# Patient Record
Sex: Female | Born: 1955 | Race: White | Hispanic: No | Smoking: Current some day smoker
Health system: Southern US, Community
[De-identification: ages and names within clinical notes are randomized; demographics above are authoritative.]

## PROBLEM LIST (undated history)

## (undated) DIAGNOSIS — K219 Gastro-esophageal reflux disease without esophagitis: Secondary | ICD-10-CM

## (undated) DIAGNOSIS — E039 Hypothyroidism, unspecified: Secondary | ICD-10-CM

## (undated) DIAGNOSIS — G709 Myoneural disorder, unspecified: Secondary | ICD-10-CM

## (undated) DIAGNOSIS — G4730 Sleep apnea, unspecified: Secondary | ICD-10-CM

## (undated) DIAGNOSIS — R51 Headache: Secondary | ICD-10-CM

## (undated) DIAGNOSIS — Z8489 Family history of other specified conditions: Secondary | ICD-10-CM

## (undated) DIAGNOSIS — F419 Anxiety disorder, unspecified: Secondary | ICD-10-CM

## (undated) DIAGNOSIS — R519 Headache, unspecified: Secondary | ICD-10-CM

## (undated) DIAGNOSIS — I10 Essential (primary) hypertension: Secondary | ICD-10-CM

## (undated) HISTORY — PX: STAPEDECTOMY: SHX2435

---

## 2004-04-17 ENCOUNTER — Other Ambulatory Visit: Payer: Self-pay

## 2004-06-03 ENCOUNTER — Ambulatory Visit: Payer: Self-pay | Admitting: Unknown Physician Specialty

## 2005-07-30 ENCOUNTER — Ambulatory Visit: Payer: Self-pay | Admitting: Internal Medicine

## 2005-10-20 ENCOUNTER — Encounter: Payer: Self-pay | Admitting: Neurology

## 2005-11-02 ENCOUNTER — Encounter: Payer: Self-pay | Admitting: Neurology

## 2005-12-02 ENCOUNTER — Encounter: Payer: Self-pay | Admitting: Neurology

## 2012-11-12 ENCOUNTER — Ambulatory Visit: Payer: Self-pay | Admitting: Neurology

## 2012-11-12 LAB — CREATININE, SERUM
EGFR (African American): >60
EGFR (Non-African Amer.): >60

## 2016-04-02 NOTE — Patient Instructions (Signed)
  Your procedure is scheduled on: 04-08-16 (TUESDAY) Report to Same Day Surgery 2nd floor medical mall To find out your arrival time please call [phone] between 1PM - 3PM on 04-04-16 [facility](FRIEDAY)  Remember: Instructions that are not followed completely may result in serious medical risk, up to and including death, or upon the discretion of your surgeon and anesthesiologist your surgery may need to be rescheduled.    _x___ 1. Do not eat food or drink liquids after midnight. No gum chewing or hard candies.     __x__ 2. No Alcohol for 24 hours before or after surgery.   __x__3. No Smoking for 24 prior to surgery.   ____  4. Bring all medications with you on the day of surgery if instructed.    __x__ 5. Notify your doctor if there is any change in your medical condition     (cold, fever, infections).     Do not wear jewelry, make-up, hairpins, clips or nail polish.  Do not wear lotions, powders, or perfumes. You may wear deodorant.  Do not shave 48 hours prior to surgery. Men may shave face and neck.  Do not bring valuables to the hospital.    [facility] belongings or valuables.               Contacts, dentures or bridgework may not be worn into surgery.  Leave your suitcase in the car. After surgery it may be brought to your room.  For patients admitted to the hospital, discharge time is determined by your treatment team.   Patients discharged the day of surgery will not be allowed to drive home.    Please read over the following fact sheets that you were given:   [facility] Pre[facility] and or MRSA Information   _x___ Take these medicines the morning of surgery with A SIP OF WATER:    1. BACLOFEN  2. RAMIPRIL  3. LEVOTHYROXINE  4. SERTRALINE  5. OXYBUTININ  6.  ____ Fleet Enema (as directed)   ____ Use CHG Soap or sage wipes as directed on instruction sheet   ____ Use inhalers on the day of surgery and bring to hospital day of  surgery  ____ Stop metformin 2 days prior to surgery    ____ Take 1/2 of usual insulin dose the night before surgery and none on the morning of surgery.   ____ Stop aspirin or coumadin, or plavix  _x__ Stop Anti-inflammatories such as Advil, Aleve, Ibuprofen, Motrin, Naproxen,          Naprosyn, Goodies powders or aspirin products. Ok to take Tylenol.   _X___ Stop supplements until after surgery-STOP MELATONIN NOW  ____ Bring C-Pap to the hospital.

## 2016-04-03 ENCOUNTER — Encounter
Admission: RE | Admit: 2016-04-03 | Discharge: 2016-04-03 | Disposition: A | Discharge status: Home / Self Care | Payer: Medicare Other | Source: Ambulatory Visit | Attending: Urology | Admitting: Urology

## 2016-04-03 DIAGNOSIS — I10 Essential (primary) hypertension: Secondary | ICD-10-CM | POA: Diagnosis not present

## 2016-04-03 DIAGNOSIS — Z01810 Encounter for preprocedural cardiovascular examination: Secondary | ICD-10-CM | POA: Diagnosis not present

## 2016-04-03 HISTORY — DX: Gastro-esophageal reflux disease without esophagitis: K21.9

## 2016-04-03 HISTORY — DX: Essential (primary) hypertension: I10

## 2016-04-03 HISTORY — DX: Headache: R51

## 2016-04-03 HISTORY — DX: Headache, unspecified: R51.9

## 2016-04-03 HISTORY — DX: Hypothyroidism, unspecified: E03.9

## 2016-04-03 HISTORY — DX: Sleep apnea, unspecified: G47.30

## 2016-04-03 HISTORY — DX: Family history of other specified conditions: Z84.89

## 2016-04-03 HISTORY — DX: Myoneural disorder, unspecified: G70.9

## 2016-04-08 ENCOUNTER — Ambulatory Visit: Payer: Medicare Other | Admitting: Anesthesiology

## 2016-04-08 ENCOUNTER — Encounter: Payer: Self-pay | Admitting: *Deleted

## 2016-04-08 ENCOUNTER — Encounter
Admission: RE | Disposition: A | Discharge status: Home / Self Care | Payer: Self-pay | Source: Ambulatory Visit | Attending: Urology

## 2016-04-08 ENCOUNTER — Ambulatory Visit
Admission: RE | Admit: 2016-04-08 | Discharge: 2016-04-08 | Disposition: A | Discharge status: Home / Self Care | Payer: Medicare Other | Source: Ambulatory Visit | Attending: Urology | Admitting: Urology

## 2016-04-08 DIAGNOSIS — Z79899 Other long term (current) drug therapy: Secondary | ICD-10-CM | POA: Insufficient documentation

## 2016-04-08 DIAGNOSIS — N319 Neuromuscular dysfunction of bladder, unspecified: Secondary | ICD-10-CM | POA: Diagnosis not present

## 2016-04-08 DIAGNOSIS — F17200 Nicotine dependence, unspecified, uncomplicated: Secondary | ICD-10-CM | POA: Insufficient documentation

## 2016-04-08 DIAGNOSIS — I10 Essential (primary) hypertension: Secondary | ICD-10-CM | POA: Diagnosis not present

## 2016-04-08 DIAGNOSIS — G4730 Sleep apnea, unspecified: Secondary | ICD-10-CM | POA: Diagnosis not present

## 2016-04-08 DIAGNOSIS — Z9889 Other specified postprocedural states: Secondary | ICD-10-CM | POA: Diagnosis not present

## 2016-04-08 DIAGNOSIS — N3941 Urge incontinence: Secondary | ICD-10-CM | POA: Diagnosis not present

## 2016-04-08 DIAGNOSIS — E039 Hypothyroidism, unspecified: Secondary | ICD-10-CM | POA: Diagnosis not present

## 2016-04-08 DIAGNOSIS — N3281 Overactive bladder: Secondary | ICD-10-CM | POA: Insufficient documentation

## 2016-04-08 DIAGNOSIS — Z791 Long term (current) use of non-steroidal anti-inflammatories (NSAID): Secondary | ICD-10-CM | POA: Insufficient documentation

## 2016-04-08 HISTORY — PX: CYSTOSCOPY: SHX5120

## 2016-04-08 HISTORY — PX: BOTOX INJECTION: SHX5754

## 2016-04-08 SURGERY — CYSTOSCOPY
Anesthesia: General | Site: Bladder | Wound class: Clean Contaminated

## 2016-04-08 MED ORDER — SODIUM CHLORIDE 0.9 % IJ SOLN
INTRAMUSCULAR | Status: AC
  Filled 2016-04-08: qty 50

## 2016-04-08 MED ORDER — LIDOCAINE HCL 2 % EX GEL
CUTANEOUS | Status: AC
  Filled 2016-04-08: qty 10

## 2016-04-08 MED ORDER — ONDANSETRON HCL 4 MG/2ML IJ SOLN: 4.0000 mg | Freq: Once | INTRAMUSCULAR | Status: DC | PRN

## 2016-04-08 MED ORDER — BELLADONNA ALKALOIDS-OPIUM 16.2-60 MG RE SUPP
RECTAL | Status: AC
  Filled 2016-04-08: qty 1

## 2016-04-08 MED ORDER — EPHEDRINE SULFATE 50 MG/ML IJ SOLN
INTRAMUSCULAR | Status: DC | PRN
  Administered 2016-04-08 (×2): 5 mg via INTRAVENOUS

## 2016-04-08 MED ORDER — MIDAZOLAM HCL 5 MG/5ML IJ SOLN
INTRAMUSCULAR | Status: DC | PRN
  Administered 2016-04-08: 2 mg via INTRAVENOUS

## 2016-04-08 MED ORDER — PROPOFOL 10 MG/ML IV BOLUS
INTRAVENOUS | Status: DC | PRN
  Administered 2016-04-08: 150 mg via INTRAVENOUS

## 2016-04-08 MED ORDER — ONDANSETRON HCL 4 MG/2ML IJ SOLN
INTRAMUSCULAR | Status: DC | PRN
  Administered 2016-04-08: 4 mg via INTRAVENOUS

## 2016-04-08 MED ORDER — URIBEL 118 MG PO CAPS: 1.0000 | ORAL_CAPSULE | Freq: Four times a day (QID) | ORAL | 3 refills | Status: DC | PRN

## 2016-04-08 MED ORDER — LIDOCAINE HCL 2 % EX GEL
CUTANEOUS | Status: DC | PRN
  Administered 2016-04-08: 1

## 2016-04-08 MED ORDER — FENTANYL CITRATE (PF) 100 MCG/2ML IJ SOLN: 25.0000 ug | INTRAMUSCULAR | Status: DC | PRN

## 2016-04-08 MED ORDER — PHENYLEPHRINE HCL 10 MG/ML IJ SOLN
INTRAMUSCULAR | Status: DC | PRN
  Administered 2016-04-08: 50 ug via INTRAVENOUS

## 2016-04-08 MED ORDER — FENTANYL CITRATE (PF) 100 MCG/2ML IJ SOLN
INTRAMUSCULAR | Status: DC | PRN
  Administered 2016-04-08 (×2): 25 ug via INTRAVENOUS

## 2016-04-08 MED ORDER — DEXAMETHASONE SODIUM PHOSPHATE 10 MG/ML IJ SOLN
INTRAMUSCULAR | Status: DC | PRN
  Administered 2016-04-08: 10 mg via INTRAVENOUS

## 2016-04-08 MED ORDER — LEVOFLOXACIN IN D5W 500 MG/100ML IV SOLN
INTRAVENOUS | Status: AC
  Administered 2016-04-08: 500 mg via INTRAVENOUS
  Filled 2016-04-08: qty 100

## 2016-04-08 MED ORDER — FAMOTIDINE 20 MG PO TABS
20.0000 mg | ORAL_TABLET | Freq: Once | ORAL | Status: AC
  Administered 2016-04-08: 20 mg via ORAL

## 2016-04-08 MED ORDER — LIDOCAINE HCL (CARDIAC) 20 MG/ML IV SOLN
INTRAVENOUS | Status: DC | PRN
  Administered 2016-04-08: 45 mg via INTRAVENOUS

## 2016-04-08 MED ORDER — ONABOTULINUMTOXINA 100 UNITS IJ SOLR
INTRAMUSCULAR | Status: DC | PRN
  Administered 2016-04-08: 100 [IU] via INTRAMUSCULAR

## 2016-04-08 MED ORDER — FAMOTIDINE 20 MG PO TABS
ORAL_TABLET | ORAL | Status: AC
Start: 2016-04-08 — End: 2016-04-08
  Administered 2016-04-08: 20 mg via ORAL
  Filled 2016-04-08: qty 1

## 2016-04-08 MED ORDER — LEVOFLOXACIN IN D5W 500 MG/100ML IV SOLN
500.0000 mg | Freq: Once | INTRAVENOUS | Status: AC
  Administered 2016-04-08: 500 mg via INTRAVENOUS

## 2016-04-08 MED ORDER — LACTATED RINGERS IV SOLN
INTRAVENOUS | Status: DC
  Administered 2016-04-08 (×2): via INTRAVENOUS

## 2016-04-08 SURGICAL SUPPLY — 20 items
BAG DRAIN CYSTO-URO LG1000N (MISCELLANEOUS) ×4 IMPLANT
ELECT REM PT RETURN 9FT ADLT (ELECTROSURGICAL)
ELECTRODE REM PT RTRN 9FT ADLT (ELECTROSURGICAL) IMPLANT
GLOVE BIO SURGEON STRL SZ7 (GLOVE) ×4 IMPLANT
GLOVE BIO SURGEON STRL SZ7.5 (GLOVE) ×4 IMPLANT
GOWN STRL REUS W/ TWL LRG LVL3 (GOWN DISPOSABLE) ×2 IMPLANT
GOWN STRL REUS W/ TWL XL LVL3 (GOWN DISPOSABLE) ×2 IMPLANT
GOWN STRL REUS W/TWL LRG LVL3 (GOWN DISPOSABLE) ×2
GOWN STRL REUS W/TWL XL LVL3 (GOWN DISPOSABLE) ×2
KIT RM TURNOVER CYSTO AR (KITS) ×4 IMPLANT
NDL SAFETY ECLIPSE 18X1.5 (NEEDLE) ×4 IMPLANT
NEEDLE HYPO 18GX1.5 SHARP (NEEDLE) ×4
NEEDLE INJETAK FLEX 70CM BOTOX (NEEDLE) ×4 IMPLANT
PACK CYSTO AR (MISCELLANEOUS) ×4 IMPLANT
PREP PVP WINGED SPONGE (MISCELLANEOUS) IMPLANT
SET CYSTO W/LG BORE CLAMP LF (SET/KITS/TRAYS/PACK) ×4 IMPLANT
SURGILUBE 2OZ TUBE FLIPTOP (MISCELLANEOUS) ×4 IMPLANT
SYRINGE 10CC LL (SYRINGE) ×8 IMPLANT
WATER STERILE IRR 1000ML POUR (IV SOLUTION) ×4 IMPLANT
WATER STERILE IRR 3000ML UROMA (IV SOLUTION) ×4 IMPLANT

## 2016-04-08 NOTE — Anesthesia Preprocedure Evaluation (Signed)
Anesthesia Evaluation  Patient identified by MRN, date of birth, ID band Patient awake    Reviewed: Allergy & Precautions, NPO status , Patient's Chart, lab work & pertinent test results  History of Anesthesia Complications Negative for: history of anesthetic complications  Airway Mallampati: II       Dental   Pulmonary sleep apnea , Current Smoker,           Cardiovascular hypertension, Pt. on medications      Neuro/Psych    GI/Hepatic Neg liver ROS, GERD (pt denies)  Medicated,  Endo/Other  Hypothyroidism   Renal/GU negative Renal ROS     Musculoskeletal   Abdominal   Peds  Hematology negative hematology ROS (+)   Anesthesia Other Findings   Reproductive/Obstetrics                             Anesthesia Physical Anesthesia Plan  ASA: III  Anesthesia Plan: General   Post-op Pain Management:    Induction:   Airway Management Planned: LMA  Additional Equipment:   Intra-op Plan:   Post-operative Plan:   Informed Consent: I have reviewed the patients History and Physical, chart, labs and discussed the procedure including the risks, benefits and alternatives for the proposed anesthesia with the patient or authorized representative who has indicated his/her understanding and acceptance.     Plan Discussed with:   Anesthesia Plan Comments:         Anesthesia Quick Evaluation

## 2016-04-08 NOTE — H&P (Signed)
Date of Initial H&P: 04/01/16  History reviewed, patient examined, no change in status, stable for surgery.

## 2016-04-08 NOTE — Anesthesia Postprocedure Evaluation (Signed)
Anesthesia Post Note  Patient: Melissa FurbishKathey E Hunter  Procedure(s) Performed: Procedure(s) (LRB): CYSTOSCOPY (N/A) BOTOX INJECTION (N/A)  Patient location during evaluation: PACU Anesthesia Type: General Level of consciousness: awake and alert Pain management: pain level controlled Vital Signs Assessment: post-procedure vital signs reviewed and stable Respiratory status: spontaneous breathing and respiratory function stable Cardiovascular status: stable Anesthetic complications: no    Last Vitals:  Vitals:   04/08/16 1610 04/08/16 1625  BP: 118/89 123/81  Pulse: 74 63  Resp: 18 13  Temp: (!) 35.8 C     Last Pain:  Vitals:   04/08/16 1610  TempSrc:   PainSc: Asleep                 KEPHART,WILLIAM K

## 2016-04-08 NOTE — Anesthesia Procedure Notes (Signed)
Procedure Name: LMA Insertion Date/Time: 04/08/2016 3:33 PM Performed by: Lily Kocher Pre-anesthesia Checklist: Patient identified, Patient being monitored, Timeout performed, Emergency Drugs available and Suction available Patient Re-evaluated:Patient Re-evaluated prior to inductionOxygen Delivery Method: Circle system utilized Preoxygenation: Pre-oxygenation with 100% oxygen Intubation Type: IV induction Ventilation: Mask ventilation without difficulty LMA: LMA inserted LMA Size: 4.0 Tube type: Oral Number of attempts: 1 Placement Confirmation: positive ETCO2 Tube secured with: Tape Dental Injury: Teeth and Oropharynx as per pre-operative assessment

## 2016-04-08 NOTE — Discharge Instructions (Addendum)
AMBULATORY SURGERY  DISCHARGE INSTRUCTIONS   1) The drugs that you were given will stay in your system until tomorrow so for the next 24 hours you should not:  A) Drive an automobile B) Make any legal decisions C) Drink any alcoholic beverage   2) You may resume regular meals tomorrow.  Today it is better to start with liquids and gradually work up to solid foods.  You may eat anything you prefer, but it is better to start with liquids, then soup and crackers, and gradually work up to solid foods.   3) Please notify your doctor immediately if you have any unusual bleeding, trouble breathing, redness and pain at the surgery site, drainage, fever, or pain not relieved by medication.   4) Additional Instructions:  Botulinum Toxin Bladder Injection, Care After Refer to this sheet in the next few weeks. These instructions provide you with information about caring for yourself after your procedure. Your health care provider may also give you more specific instructions. Your treatment has been planned according to current medical practices, but problems sometimes occur. Call your health care provider if you have any problems or questions after your procedure. WHAT TO EXPECT AFTER THE PROCEDURE After your procedure, it is common to have:  Blood-tinged urine.  Burning or soreness when you pass urine. HOME CARE INSTRUCTIONS  Do not drive for 24 hours if you received a sedative.  Take over-the-counter and prescription medicines only as told by your health care provider.  If you were prescribed an antibiotic medicine, take it as told by your health care provider. Do not stop taking the antibiotic even if you start to feel better.  Drink enough fluid to keep your urine clear or pale yellow.  Return to your normal activities as told by your health care provider. Ask your health care provider what activities are safe for you.  Keep all follow-up visits as told by your health care provider.  This is important. SEEK MEDICAL CARE IF:  You have a fever or chills.  You have blood-tinged urine for more than one day after your procedure.  You have worsening pain or burning when you pass urine.  You have pain or burning when passing urine for more than two days after your procedure.  You have trouble emptying your bladder. SEEK IMMEDIATE MEDICAL CARE IF:  You have bright red blood in your urine.  You are unable to pass urine.   This information is not intended to replace advice given to you by your health care provider. Make sure you discuss any questions you have with your health care provider.   Document Released: 04/11/2015 Document Reviewed: 01/17/2015 Elsevier Interactive Patient Education 2016 [employer] Neurogenic Bladder Neurogenic bladder is a bladder control disorder. It is caused by problems with the nerves that control your bladder. This condition may make your bladder overactive or underactive. You may have trouble holding your urine or passing your urine (urinating). The bladder is a hollow organ in the lower part of your abdomen. It stores urine after the urine is made by your kidneys. Normally, when your bladder is not full, the muscles that control your bladder are relaxed. When your bladder fills with urine, nerve signals are sent to your brain, indicating that your bladder is full. Your brain then sends signals through your spinal cord to the muscles in your bladder that start and stop urine flow. If you have neurogenic bladder, the nerves and muscles do not work together the way they  should. CAUSES  Any kind of nerve damage or condition that disrupts the signals from your brain to your bladder can cause neurogenic bladder. Many things can cause these nerve problems. They include:  A disease that affects the nervous system, such as:  Alzheimer disease.  Cerebral palsy.  Multiple sclerosis.  Diabetes.  Parkinson disease.  Damage to your brain or  spinal cord from:  Trauma.  Tumor.  Infection.  Surgery.  Alcohol abuse.  Stroke.  A spinal cord birth defect. RISK FACTORS Having nerve damage or a nerve disorder puts you at risk for neurogenic bladder. SIGNS AND SYMPTOMS  Signs and symptoms of neurogenic bladder include:  Leaking or gushing urine (incontinence).  A sudden, strong urge to pass urine (urgency).  Frequent urination during the day and night.  Being unable to empty your bladder completely (urinary retention).  Frequent urinary tract infections. DIAGNOSIS  Your health care provider may diagnose neurogenic bladder based on your symptoms and medical history. A physical exam will also be done. You may be asked to keep a record of your bladder symptoms and the times that you urinate (bladder diary). Your health care provider may also do several tests to help diagnose neurogenic bladder, including:  A urine test to check for infection.  A bladder scan after you urinate to see how much urine is left in your bladder.  Various tests to measure your urine flow and see how well the flow is controlled (urodynamic tests).  A procedure that involves using a tool that is like a very thin telescope to look through your urethra into your bladder (cystoscopy). A health care provider who specializes in the urinary tract (urologist) may do this test.  Imaging tests of your brain or spine. TREATMENT  Treatment depends on the cause of your neurogenic bladder and the symptoms you are having. Work closely with your health care provider to find the treatments that will improve your quality of life. Treatment options include:  Learning ways to control when you urinate, such as:  Urinating at scheduled times.  Training yourself to delay urination.  Doing exercises (Kegel exercises) to strengthen the muscles that control urine flow.  Avoiding foods or drinks that make your symptoms worse.  Taking medicines to:  Stimulate an  underactive bladder.  Relax an overactive bladder.  Treat a urinary tract infection.  Learning how to use a thin tube (catheter) to empty your bladder. A catheter is a hollow tube that you pass through your urethra.  Procedures to stimulate the nerves that control your bladder.  Surgical procedures if other treatments do not help. HOME CARE INSTRUCTIONS   Keep a bladder diary to find out which foods, liquids, or activities make your symptoms worse.  Use your bladder diary to schedule bathroom trips. If you are away from home, plan to be near a bathroom when your schedule says you need one.  Do Kegel exercises to strengthen the muscles that control urination. These muscles are the ones you use to try to hold urine when you need to go. To do Kegel exercises:  Squeeze these muscles tight and hold for about 10 seconds.  Repeat three times.  Do these exercises often during the day when you do not have to urinate.  Limit your drinking of beverages that stimulate urination. These include soda, coffee, and tea.  After urinating, wait a few minutes and try again (double voiding).  Make sure you urinate just before you leave the house and just before you  go to bed.  Take medicines only as directed by your health care provider.  Keep all follow-up visits as directed by your health care provider. This is important. SEEK MEDICAL CARE IF:   You are having a hard time controlling your symptoms.  Your symptoms are getting worse.  You have signs of a urinary tract infection:  A burning feeling when you urinate.  Chills.  Fever. SEEK IMMEDIATE MEDICAL CARE IF:  You cannot pass urine.    This information is not intended to replace advice given to you by your health care provider. Make sure you discuss any questions you have with your health care provider.   Document Released: 02/01/2007 Document Revised: 08/11/2014 Document Reviewed: 11/01/2013 Elsevier Interactive Patient Education  [employer]

## 2016-04-08 NOTE — Transfer of Care (Signed)
Immediate Anesthesia Transfer of Care Note  Patient: Melissa Hunter  Procedure(s) Performed: Procedure(s): CYSTOSCOPY (N/A) BOTOX INJECTION (N/A)  Patient Location: PACU  Anesthesia Type:General  Level of Consciousness: sedated  Airway & Oxygen Therapy: Patient Spontanous Breathing and Patient connected to face mask oxygen  Post-op Assessment: Report given to RN and Post -op Vital signs reviewed and stable  Post vital signs: Reviewed and stable  Last Vitals:  Vitals:   04/08/16 1348 04/08/16 1610  BP: (!) 139/95 118/89  Pulse: 77 74  Resp: 16 18  Temp: 36.8 C (!) 35.8 C    Last Pain:  Vitals:   04/08/16 1610  TempSrc:   PainSc: Asleep         Complications: No apparent anesthesia complications

## 2016-04-08 NOTE — Op Note (Signed)
Preoperative diagnosis: 1. Urge incontinence                                            2. Overactive bladder                                            3. Neurogenic bladder   Postoperative diagnosis: Same  Procedure: Cystoscopy with bladder Botox injection  Surgeon: Suszanne ConnersMichael R. Evelene CroonWolff MD  Anesthesia: General  Indications:See the history and physical. After informed consent the above procedure(s) were requested     Technique and findings: After adequate general anesthesia had been obtained the patient was placed into dorsolithotomy position and the perineum was prepped and draped in the usual fashion. The 3521 French the scope was coupled to the camera and then placed into the bladder. The bladder was thoroughly inspected. Ureteral orifices were identified and had clear reflux. 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 No bladder mucosal tumors were identified. The bladder was not trabeculated. At this point Botox injection needle was introduced through the scope and injection pattern of 4 rows of 5 injections with 0.5 cc of Botox was completed. The depth of injection was 3 mm. At this point the bladder was drained and the cystoscope removed. 10 cc of viscous Xylocaine was instilled within the urethra and the bladder. The procedure was terminated and patient transferred to the recovery room in stable condition.

## 2016-04-10 ENCOUNTER — Encounter: Payer: Self-pay | Admitting: Urology

## 2016-04-16 ENCOUNTER — Other Ambulatory Visit: Payer: Self-pay | Admitting: Neurology

## 2016-04-16 DIAGNOSIS — G35 Multiple sclerosis: Secondary | ICD-10-CM

## 2016-06-14 ENCOUNTER — Ambulatory Visit
Admission: RE | Admit: 2016-06-14 | Discharge: 2016-06-14 | Disposition: A | Discharge status: Home / Self Care | Payer: Medicare Other | Source: Ambulatory Visit | Attending: Neurology | Admitting: Neurology

## 2016-06-14 DIAGNOSIS — M2578 Osteophyte, vertebrae: Secondary | ICD-10-CM | POA: Insufficient documentation

## 2016-06-14 DIAGNOSIS — M4802 Spinal stenosis, cervical region: Secondary | ICD-10-CM | POA: Diagnosis not present

## 2016-06-14 DIAGNOSIS — G35 Multiple sclerosis: Secondary | ICD-10-CM | POA: Diagnosis present

## 2016-06-14 DIAGNOSIS — M50221 Other cervical disc displacement at C4-C5 level: Secondary | ICD-10-CM | POA: Diagnosis not present

## 2016-10-27 ENCOUNTER — Encounter
Admission: RE | Admit: 2016-10-27 | Discharge: 2016-10-27 | Disposition: A | Discharge status: Home / Self Care | Payer: Medicare Other | Source: Ambulatory Visit | Attending: Urology | Admitting: Urology

## 2016-10-27 NOTE — Patient Instructions (Signed)
  Your procedure is scheduled on: 11-04-16 Report to Same Day Surgery 2nd floor medical mall [facility] Entrance-take elevator on left to 2nd floor.  Check in with surgery information desk.) To find out your arrival time please call [phone] between 1PM - 3PM on 11-03-16  Remember: Instructions that are not followed completely may result in serious medical risk, up to and including death, or upon the discretion of your surgeon and anesthesiologist your surgery may need to be rescheduled.    _x___ 1. Do not eat food or drink liquids after midnight. No gum chewing or hard candies.     __x__ 2. No Alcohol for 24 hours before or after surgery.   __x__3. No Smoking for 24 prior to surgery.   ____  4. Bring all medications with you on the day of surgery if instructed.    __x__ 5. Notify your doctor if there is any change in your medical condition     (cold, fever, infections).     Do not wear jewelry, make-up, hairpins, clips or nail polish.  Do not wear lotions, powders, or perfumes. You may wear deodorant.  Do not shave 48 hours prior to surgery. Men may shave face and neck.  Do not bring valuables to the hospital.    [facility] is not responsible for any belongings or valuables.               Contacts, dentures or bridgework may not be worn into surgery.  Leave your suitcase in the car. After surgery it may be brought to your room.  For patients admitted to the hospital, discharge time is determined by your treatment team.   Patients discharged the day of surgery will not be allowed to drive home.  You will need someone to drive you home and stay with you the night of your procedure.    Please read over the following fact sheets that you were given:     _x___ Take anti-hypertensive (unless it includes a diuretic), cardiac, seizure, asthma,     anti-reflux and psychiatric medicines. These include:  1. ALTACE  2. BACLOFEN  3. LEVOTHYROXINE  4. SERTRALINE  5.  6.  ____Fleets  enema or Magnesium Citrate as directed.   ____ Use CHG Soap or sage wipes as directed on instruction sheet   ____ Use inhalers on the day of surgery and bring to hospital day of surgery  ____ Stop Metformin and Janumet 2 days prior to surgery.    ____ Take 1/2 of usual insulin dose the night before surgery and none on the morning surgery.   ____ Follow recommendations from Cardiologist, Pulmonologist or PCP regarding stopping Aspirin, Coumadin, Pllavix ,Eliquis, Effient, or Pradaxa, and Pletal.  X____Stop Anti-inflammatories such as Advil, Aleve, Ibuprofen, Motrin, Naproxen, Naprosyn, MELOXICAM, Goodies powders or aspirin products NOW-OK to take Tylenol    ____ Stop supplements until after surgery. .   _X___ Bring C-Pap to the hospital.

## 2016-10-30 NOTE — H&P (Signed)
NAME:  Melissa Hunter, Melissa Hunter NO.:  MEDICAL RECORD NO.:  [id number]  LOCATION:                                 FACILITY:  PHYSICIAN:  Anola Gurney          DATE OF BIRTH:  1956-06-22  DATE OF ADMISSION: DATE OF DISCHARGE:                            HISTORY AND PHYSICAL   Same-day surgery scheduled April 3.  CHIEF COMPLAINT:  Overactive bladder with urge incontinence.  HISTORY OF PRESENT ILLNESS:  Melissa Hunter is a 61 year old Caucasian female with overactive bladder, urge incontinence, and multiple sclerosis.  She had underwent bladder Botox injection on April 08, 2016 with 100 units of Botox.  She did extremely well with that.  Her voiding symptoms have recurred in the past month.  She comes in now for cystoscopy with bladder Botox injection.  PAST MEDICAL HISTORY:  ALLERGIES:  NO DRUG ALLERGIES.  CURRENT MEDICATIONS:  Baclofen, ramipril, levothyroxine, sertraline, Avonex, vitamin D, atorvastatin, melatonin, Provigil, meloxicam, and trazodone.  SURGICAL HISTORY: 1. Right stapedectomy, 2005 and 2015. 2. Bladder Botox injections, September 2017.  SOCIAL HISTORY:  The patient smokes half a pack a day.  Has a 25-pack- year history.  She consumes 1 to 2 alcoholic beverages per week.  FAMILY HISTORY:  Father died age 38 of diabetic renal failure.  PAST AND CURRENT MEDICAL CONDITIONS: 1. Multiple sclerosis since 1999. 2. Hypertension since 1990. 3. Hypercholesterolemia since 1990. 4. Hypothyroidism since 1990.  REVIEW OF SYSTEMS:  The patient has difficulty with hand writing and walking as a complication of multiple sclerosis.  She has heat and cold intolerance.  She has insomnia.  She denies chest pain, shortness of breath, diabetes, or stroke.  PHYSICAL EXAMINATION:  GENERAL:  Obese white female, in no acute distress. HEENT:  Sclerae were clear.  Pupils were equally round, react to light and accommodation. NECK:  Supple.  No palpable cervical  adenopathy. LUNGS:  Clear to auscultation. CARDIOVASCULAR:  Regular rhythm and rate without audible murmurs. ABDOMEN:  Soft, nontender abdomen. GU:  Deferred. RECTAL:  Deferred. NEUROMUSCULAR:  Alert and oriented x3.  IMPRESSION: 1. Overactive bladder. 2. Urge incontinence. 3. Multiple sclerosis. 4. Probable neurogenic bladder.  PLAN:  Cystoscopy with bladder Botox injection.          ______________________________ Anola Gurney     MW/MEDQ  D:  10/29/2016  T:  10/29/2016  Job:  [id number]

## 2016-11-04 ENCOUNTER — Ambulatory Visit: Payer: Medicare Other | Admitting: Anesthesiology

## 2016-11-04 ENCOUNTER — Ambulatory Visit
Admission: RE | Admit: 2016-11-04 | Discharge: 2016-11-04 | Disposition: A | Discharge status: Home / Self Care | Payer: Medicare Other | Source: Ambulatory Visit | Attending: Urology | Admitting: Urology

## 2016-11-04 ENCOUNTER — Encounter: Payer: Self-pay | Admitting: *Deleted

## 2016-11-04 ENCOUNTER — Encounter
Admission: RE | Disposition: A | Discharge status: Home / Self Care | Payer: Self-pay | Source: Ambulatory Visit | Attending: Urology

## 2016-11-04 DIAGNOSIS — E7800 Pure hypercholesterolemia, unspecified: Secondary | ICD-10-CM | POA: Insufficient documentation

## 2016-11-04 DIAGNOSIS — Z79899 Other long term (current) drug therapy: Secondary | ICD-10-CM | POA: Diagnosis not present

## 2016-11-04 DIAGNOSIS — F17210 Nicotine dependence, cigarettes, uncomplicated: Secondary | ICD-10-CM | POA: Insufficient documentation

## 2016-11-04 DIAGNOSIS — N3281 Overactive bladder: Secondary | ICD-10-CM | POA: Diagnosis not present

## 2016-11-04 DIAGNOSIS — I10 Essential (primary) hypertension: Secondary | ICD-10-CM | POA: Diagnosis not present

## 2016-11-04 DIAGNOSIS — N3941 Urge incontinence: Secondary | ICD-10-CM | POA: Diagnosis not present

## 2016-11-04 DIAGNOSIS — E039 Hypothyroidism, unspecified: Secondary | ICD-10-CM | POA: Diagnosis not present

## 2016-11-04 DIAGNOSIS — G4730 Sleep apnea, unspecified: Secondary | ICD-10-CM | POA: Diagnosis not present

## 2016-11-04 DIAGNOSIS — G35 Multiple sclerosis: Secondary | ICD-10-CM | POA: Insufficient documentation

## 2016-11-04 HISTORY — PX: BOTOX INJECTION: SHX5754

## 2016-11-04 HISTORY — PX: CYSTOSCOPY: SHX5120

## 2016-11-04 SURGERY — CYSTOSCOPY
Anesthesia: General | Wound class: Clean Contaminated

## 2016-11-04 MED ORDER — LIDOCAINE HCL 2 % EX GEL
CUTANEOUS | Status: AC
  Filled 2016-11-04: qty 15

## 2016-11-04 MED ORDER — LIDOCAINE HCL 2 % EX GEL
CUTANEOUS | Status: DC | PRN
  Administered 2016-11-04: 1

## 2016-11-04 MED ORDER — PROPOFOL 10 MG/ML IV BOLUS
INTRAVENOUS | Status: AC
  Filled 2016-11-04: qty 40

## 2016-11-04 MED ORDER — LACTATED RINGERS IV SOLN
INTRAVENOUS | Status: DC
  Administered 2016-11-04: 13:00:00 via INTRAVENOUS

## 2016-11-04 MED ORDER — ONDANSETRON HCL 4 MG/2ML IJ SOLN: 4.0000 mg | Freq: Once | INTRAMUSCULAR | Status: DC | PRN

## 2016-11-04 MED ORDER — FAMOTIDINE 20 MG PO TABS
20.0000 mg | ORAL_TABLET | Freq: Once | ORAL | Status: AC
Start: 2016-11-04 — End: 2016-11-04
  Administered 2016-11-04: 20 mg via ORAL

## 2016-11-04 MED ORDER — PROPOFOL 500 MG/50ML IV EMUL
INTRAVENOUS | Status: DC | PRN
  Administered 2016-11-04: 160 ug/kg/min via INTRAVENOUS

## 2016-11-04 MED ORDER — FENTANYL CITRATE (PF) 100 MCG/2ML IJ SOLN
INTRAMUSCULAR | Status: AC
  Filled 2016-11-04: qty 2

## 2016-11-04 MED ORDER — FENTANYL CITRATE (PF) 100 MCG/2ML IJ SOLN: 25.0000 ug | INTRAMUSCULAR | Status: DC | PRN

## 2016-11-04 MED ORDER — LEVOFLOXACIN IN D5W 500 MG/100ML IV SOLN
INTRAVENOUS | Status: AC
  Administered 2016-11-04: 500 mg via INTRAVENOUS
  Filled 2016-11-04: qty 100

## 2016-11-04 MED ORDER — LIDOCAINE HCL (CARDIAC) 20 MG/ML IV SOLN
INTRAVENOUS | Status: DC | PRN
  Administered 2016-11-04: 30 mg via INTRAVENOUS

## 2016-11-04 MED ORDER — LIDOCAINE HCL 2 % EX GEL
CUTANEOUS | Status: AC
  Filled 2016-11-04: qty 10

## 2016-11-04 MED ORDER — FENTANYL CITRATE (PF) 100 MCG/2ML IJ SOLN
INTRAMUSCULAR | Status: DC | PRN
  Administered 2016-11-04 (×2): 50 ug via INTRAVENOUS

## 2016-11-04 MED ORDER — MIDAZOLAM HCL 2 MG/2ML IJ SOLN
INTRAMUSCULAR | Status: AC
  Filled 2016-11-04: qty 2

## 2016-11-04 MED ORDER — LIDOCAINE HCL (PF) 2 % IJ SOLN
INTRAMUSCULAR | Status: AC
  Filled 2016-11-04: qty 2

## 2016-11-04 MED ORDER — SODIUM CHLORIDE 0.9 % IJ SOLN
INTRAMUSCULAR | Status: AC
  Filled 2016-11-04: qty 50

## 2016-11-04 MED ORDER — BELLADONNA ALKALOIDS-OPIUM 16.2-60 MG RE SUPP
RECTAL | Status: AC
  Filled 2016-11-04: qty 1

## 2016-11-04 MED ORDER — ONABOTULINUMTOXINA 100 UNITS IJ SOLR
INTRAMUSCULAR | Status: DC | PRN
  Administered 2016-11-04: 100 [IU]

## 2016-11-04 MED ORDER — FAMOTIDINE 20 MG PO TABS
ORAL_TABLET | ORAL | Status: AC
  Administered 2016-11-04: 20 mg via ORAL
  Filled 2016-11-04: qty 1

## 2016-11-04 MED ORDER — LEVOFLOXACIN IN D5W 500 MG/100ML IV SOLN
500.0000 mg | Freq: Once | INTRAVENOUS | Status: AC
  Administered 2016-11-04: 500 mg via INTRAVENOUS

## 2016-11-04 MED ORDER — BELLADONNA ALKALOIDS-OPIUM 16.2-60 MG RE SUPP
RECTAL | Status: DC | PRN
  Administered 2016-11-04: 1 via RECTAL

## 2016-11-04 MED ORDER — MIDAZOLAM HCL 2 MG/2ML IJ SOLN
INTRAMUSCULAR | Status: DC | PRN
  Administered 2016-11-04 (×2): 1 mg via INTRAVENOUS

## 2016-11-04 SURGICAL SUPPLY — 24 items
BAG DRAIN CYSTO-URO LG1000N (MISCELLANEOUS) ×3 IMPLANT
BRUSH SCRUB EZ 1% IODOPHOR (MISCELLANEOUS) ×3 IMPLANT
ELECT REM PT RETURN 9FT ADLT (ELECTROSURGICAL) ×3
ELECTRODE REM PT RTRN 9FT ADLT (ELECTROSURGICAL) ×1 IMPLANT
GLOVE BIO SURGEON STRL SZ7 (GLOVE) ×3 IMPLANT
GLOVE BIO SURGEON STRL SZ7.5 (GLOVE) ×3 IMPLANT
GOWN STRL REUS W/ TWL LRG LVL3 (GOWN DISPOSABLE) ×1 IMPLANT
GOWN STRL REUS W/ TWL XL LVL3 (GOWN DISPOSABLE) ×1 IMPLANT
GOWN STRL REUS W/TWL LRG LVL3 (GOWN DISPOSABLE) ×2
GOWN STRL REUS W/TWL XL LVL3 (GOWN DISPOSABLE) ×2
KIT RM TURNOVER CYSTO AR (KITS) ×3 IMPLANT
NDL SAFETY ECLIPSE 18X1.5 (NEEDLE) ×1 IMPLANT
NEEDLE FILTER BLUNT 18X 1/2SAF (NEEDLE) ×2
NEEDLE FILTER BLUNT 18X1 1/2 (NEEDLE) ×1 IMPLANT
NEEDLE HYPO 18GX1.5 SHARP (NEEDLE) ×2
NEEDLE INJETAK FLEX 70CM BOTOX (NEEDLE) ×3 IMPLANT
PACK CYSTO AR (MISCELLANEOUS) ×3 IMPLANT
PREP PVP WINGED SPONGE (MISCELLANEOUS) ×3 IMPLANT
SET CYSTO W/LG BORE CLAMP LF (SET/KITS/TRAYS/PACK) ×3 IMPLANT
SURGILUBE 2OZ TUBE FLIPTOP (MISCELLANEOUS) ×3 IMPLANT
SYR 10ML LL (SYRINGE) ×3 IMPLANT
SYRINGE 10CC LL (SYRINGE) ×12 IMPLANT
WATER STERILE IRR 1000ML POUR (IV SOLUTION) ×3 IMPLANT
WATER STERILE IRR 3000ML UROMA (IV SOLUTION) ×3 IMPLANT

## 2016-11-04 NOTE — H&P (Signed)
Date of Initial H&P: 10/29/16  History reviewed, patient examined, no change in status, stable for surgery. 

## 2016-11-04 NOTE — Transfer of Care (Signed)
Immediate Anesthesia Transfer of Care Note  Patient: Melissa Hunter  Procedure(s) Performed: Procedure(s): CYSTOSCOPY (N/A) BOTOX INJECTION (N/A)  Patient Location: PACU  Anesthesia Type:General  Level of Consciousness: sedated  Airway & Oxygen Therapy: Patient Spontanous Breathing and Patient connected to face mask oxygen  Post-op Assessment: Report given to RN and Post -op Vital signs reviewed and stable  Post vital signs: Reviewed and stable  Last Vitals:  Vitals:   11/04/16 1237  BP: (!) 145/92  Pulse: 69  Resp: 16  Temp: 36.7 C    Last Pain: There were no vitals filed for this visit.    Patients Stated Pain Goal: 0 (11/04/16 1237)  Complications: No apparent anesthesia complications

## 2016-11-04 NOTE — Anesthesia Preprocedure Evaluation (Signed)
Anesthesia Evaluation  Patient identified by MRN, date of birth, ID band Patient awake    Reviewed: Allergy & Precautions, H&P , NPO status , Patient's Chart, lab work & pertinent test results, reviewed documented beta blocker date and time   History of Anesthesia Complications (+) Family history of anesthesia reaction  Airway Mallampati: II  TM Distance: >3 FB Neck ROM: full    Dental  (+) Teeth Intact   Pulmonary neg pulmonary ROS, sleep apnea , Current Smoker,    Pulmonary exam normal        Cardiovascular Exercise Tolerance: Good hypertension, negative cardio ROS Normal cardiovascular exam Rate:Normal     Neuro/Psych  Headaches,  Neuromuscular disease negative neurological ROS  negative psych ROS   GI/Hepatic negative GI ROS, Neg liver ROS, GERD  ,  Endo/Other  negative endocrine ROSHypothyroidism   Renal/GU negative Renal ROS  negative genitourinary   Musculoskeletal   Abdominal   Peds  Hematology negative hematology ROS (+)   Anesthesia Other Findings   Reproductive/Obstetrics negative OB ROS                             Anesthesia Physical Anesthesia Plan  ASA: III  Anesthesia Plan: General LMA   Post-op Pain Management:    Induction:   Airway Management Planned:   Additional Equipment:   Intra-op Plan:   Post-operative Plan:   Informed Consent: I have reviewed the patients History and Physical, chart, labs and discussed the procedure including the risks, benefits and alternatives for the proposed anesthesia with the patient or authorized representative who has indicated his/her understanding and acceptance.     Plan Discussed with: CRNA  Anesthesia Plan Comments:         Anesthesia Quick Evaluation

## 2016-11-04 NOTE — Op Note (Signed)
Preoperative diagnosis: Overactive bladder with incontinence  Postoperative diagnosis: Same   Procedure: Cystoscopy with bladder Botox injection    Surgeon: Suszanne Conners. Evelene Croon MD  Anesthesia: General  Indications:See the history and physical. After informed consent the above procedure(s) were requested     Technique and findings: After adequate general anesthesia been obtained the patient was placed into the lithotomy position and the perineum was prepped and draped in the usual fashion. The 21 French cystoscope was coupled to the camera and placed into the bladder. The bladder was thoroughly inspected. Both ureteral orifices were identified and had clear efflux. No bladder lesions were identified. At this point the injection needle was introduced through the scope and needle depth set at 3 mm. A total of 100 units of Botox was injected with a standard 20 injection site pattern of 0.5 cc per injection. At this point the bladder was drained and cystoscope was removed. An cc of viscous Xylocaine was instilled within the urethra. A B&O suppository was placed. The procedure was then terminated and patient transferred to the recovery room in stable condition.   and visually

## 2016-11-04 NOTE — Anesthesia Post-op Follow-up Note (Cosign Needed)
Anesthesia QCDR form completed.        

## 2016-11-04 NOTE — Discharge Instructions (Addendum)
Botulinum Toxin Bladder Injection, Care After Refer to this sheet in the next few weeks. These instructions provide you with information about caring for yourself after your procedure. Your health care provider may also give you more specific instructions. Your treatment has been planned according to current medical practices, but problems sometimes occur. Call your health care provider if you have any problems or questions after your procedure. What can I expect after the procedure? After the procedure, it is common to have:  Blood-tinged urine.  Burning or soreness when you pass urine. Follow these instructions at home:   Do not drive for 24 hours if you received a sedative.  Take over-the-counter and prescription medicines only as told by your health care provider.  If you were prescribed an antibiotic medicine, take it as told by your health care provider. Do not stop taking the antibiotic even if you start to feel better.  Drink enough fluid to keep your urine clear or pale yellow.  Return to your normal activities as told by your health care provider. Ask your health care provider what activities are safe for you.  Keep all follow-up visits as told by your health care provider. This is important. Contact a health care provider if:  You have a fever or chills.  You have blood-tinged urine for more than one day after your procedure.  You have worsening pain or burning when you pass urine.  You have pain or burning when passing urine for more than two days after your procedure.  You have trouble emptying your bladder. Get help right away if:  You have bright red blood in your urine.  You are unable to pass urine. This information is not intended to replace advice given to you by your health care provider. Make sure you discuss any questions you have with your health care provider. Document Released: 04/11/2015 Document Revised: 02/08/2016 Document Reviewed:  01/17/2015 Elsevier Interactive Patient Education  2017 [employer] Overactive Bladder, Adult Overactive bladder is a group of urinary symptoms. With overactive bladder, you may suddenly feel the need to pass urine (urinate) right away. After feeling this sudden urge, you might also leak urine if you cannot get to the bathroom fast enough (urinary incontinence). These symptoms might interfere with your daily work or social activities. Overactive bladder symptoms may also wake you up at night. Overactive bladder affects the nerve signals between your bladder and your brain. Your bladder may get the signal to empty before it is full. Very sensitive muscles can also make your bladder squeeze too soon. What are the causes? Many things can cause an overactive bladder. Possible causes include:  Urinary tract infection.  Infection of nearby tissues, such as the prostate.  Prostate enlargement.  Being pregnant with twins or more (multiples).  Surgery on the uterus or urethra.  Bladder stones, inflammation, or tumors.  Drinking too much caffeine or alcohol.  Certain medicines, especially those that you take to help your body get rid of extra fluid (diuretics) by increasing urine production.  Muscle or nerve weakness, especially from:  A spinal cord injury.  Stroke.  Multiple sclerosis.  Parkinson disease.  Diabetes. This can cause a high urine volume that fills the bladder so quickly that the normal urge to urinate is triggered very strongly.  Constipation. A buildup of too much stool can put pressure on your bladder. What increases the risk? You may be at greater risk for overactive bladder if you:  Are an older adult.  Smoke.  Are going through menopause.  Have prostate problems.  Have a neurological disease, such as stroke, dementia, Parkinson disease, or multiple sclerosis (MS).  Eat or drink things that irritate the bladder. These include alcohol, spicy food, and  caffeine.  Are overweight or obese. What are the signs or symptoms? The signs and symptoms of an overactive bladder include:  Sudden, strong urges to urinate.  Leaking urine.  Urinating eight or more times per day.  Waking up to urinate two or more times per night. How is this diagnosed? Your health care provider may suspect overactive bladder based on your symptoms. The health care provider will do a physical exam and take your medical history. Blood or urine tests may also be done. For example, you might need to have a bladder function test to check how well you can hold your urine. You might also need to see a health care provider who specializes in the urinary tract (urologist). How is this treated? Treatment for overactive bladder depends on the cause of your condition and whether it is mild or severe. Certain treatments can be done in your health care provider's office or clinic. You can also make lifestyle changes at home. Options include: Behavioral Treatments   Biofeedback. A specialist uses sensors to help you become aware of your bodys signals.  Keeping a daily log of when you need to urinate and what happens after the urge. This may help you manage your condition.  Bladder training. This helps you learn to control the urge to urinate by following a schedule that directs you to urinate at regular intervals (timed voiding). At first, you might have to wait a few minutes after feeling the urge. In time, you should be able to schedule bathroom visits an hour or more apart.  Kegel exercises. These are exercises to strengthen the pelvic floor muscles, which support the bladder. Toning these muscles can help you control urination, even if your bladder muscles are overactive. A specialist will teach you how to do these exercises correctly. They require daily practice.  Weight loss. If you are obese or overweight, losing weight might relieve your symptoms of overactive bladder. Talk to  your health care provider about losing weight and whether there is a specific program or method that would work best for you.  Diet change. This might help if constipation is making your overactive bladder worse. Your health care provider or a dietitian can explain ways to change what you eat to ease constipation. You might also need to consume less alcohol and caffeine or drink other fluids at different times of the day.  Stopping smoking.  Wearing pads to absorb leakage while you wait for other treatments to take effect. Physical Treatments   Electrical stimulation. Electrodes send gentle pulses of electricity to strengthen the nerves or muscles that help to control the bladder. Sometimes, the electrodes are placed outside of the body. In other cases, they might be placed inside the body (implanted). This treatment can take several months to have an effect.  Supportive devices. Women may need a plastic device that fits into the vagina and supports the bladder (pessary). Medicines  Several medicines can help treat overactive bladder and are usually used along with other treatments. Some are injected into the muscles involved in urination. Others come in pill form. Your health care provider may prescribe:  Antispasmodics. These medicines block the signals that the nerves send to the bladder. This keeps the bladder from releasing urine at the wrong time.  Tricyclic antidepressants. These types of antidepressants also relax bladder muscles. Surgery   You may have a device implanted to help manage the nerve signals that indicate when you need to urinate.  You may have surgery to implant electrodes for electrical stimulation.  Sometimes, very severe cases of overactive bladder require surgery to change the shape of the bladder. Follow these instructions at home:  Take medicines only as directed by your health care provider.  Use any implants or a pessary as directed by your health care  provider.  Make any diet or lifestyle changes that are recommended by your health care provider. These might include:  Drinking less fluid or drinking at different times of the day. If you need to urinate often during the night, you may need to stop drinking fluids early in the evening.  Cutting down on caffeine or alcohol. Both can make an overactive bladder worse. Caffeine is found in coffee, tea, and sodas.  Doing Kegel exercises to strengthen muscles.  Losing weight if you need to.  Eating a healthy and balanced diet to prevent constipation.  Keep a journal or log to track how much and when you drink and also when you feel the need to urinate. This will help your health care provider to monitor your condition. Contact a health care provider if:  Your symptoms do not get better after treatment.  Your pain and discomfort are getting worse.  You have more frequent urges to urinate.  You have a fever. Get help right away if: You are not able to control your bladder at all. This information is not intended to replace advice given to you by your health care provider. Make sure you discuss any questions you have with your health care provider. Document Released: 05/17/2009 Document Revised: 12/27/2015 Document Reviewed: 12/14/2013 Elsevier Interactive Patient Education  2017 Elsevier Inc.     AMBULATORY SURGERY  DISCHARGE INSTRUCTIONS   1) The drugs that you were given will stay in your system until tomorrow so for the next 24 hours you should not:  A) Drive an automobile B) Make any legal decisions C) Drink any alcoholic beverage   2) You may resume regular meals tomorrow.  Today it is better to start with liquids and gradually work up to solid foods.  You may eat anything you prefer, but it is better to start with liquids, then soup and crackers, and gradually work up to solid foods.   3) Please notify your doctor immediately if you have any unusual bleeding, trouble  breathing, redness and pain at the surgery site, drainage, fever, or pain not relieved by medication.    4) Additional Instructions:        Please contact your physician with any problems or Same Day Surgery at [phone], Monday through Friday 6 am to 4 pm, or  at [facility] number at [phone].

## 2016-11-05 ENCOUNTER — Encounter: Payer: Self-pay | Admitting: Urology

## 2016-11-06 NOTE — Anesthesia Postprocedure Evaluation (Signed)
Anesthesia Post Note  Patient: Melissa Hunter  Procedure(s) Performed: Procedure(s) (LRB): CYSTOSCOPY (N/A) BOTOX INJECTION (N/A)  Patient location during evaluation: PACU Anesthesia Type: General Level of consciousness: awake and alert Pain management: pain level controlled Vital Signs Assessment: post-procedure vital signs reviewed and stable Respiratory status: spontaneous breathing, nonlabored ventilation, respiratory function stable and patient connected to nasal cannula oxygen Cardiovascular status: blood pressure returned to baseline and stable Postop Assessment: no signs of nausea or vomiting Anesthetic complications: no     Last Vitals:  Vitals:   11/04/16 1441 11/04/16 1505  BP: 138/65 136/87  Pulse: (!) 59 (!) 51  Resp: 16 16  Temp: (!) 36 C     Last Pain:  Vitals:   11/04/16 1441  TempSrc: Temporal                 Yevette Edwards

## 2019-03-24 ENCOUNTER — Other Ambulatory Visit: Payer: Self-pay

## 2019-03-24 DIAGNOSIS — Z20822 Contact with and (suspected) exposure to covid-19: Secondary | ICD-10-CM

## 2019-03-25 LAB — NOVEL CORONAVIRUS, NAA: SARS-CoV-2, NAA: NOT DETECTED

## 2020-04-05 ENCOUNTER — Other Ambulatory Visit: Payer: Self-pay | Admitting: Acute Care

## 2020-04-05 DIAGNOSIS — G35 Multiple sclerosis: Secondary | ICD-10-CM

## 2020-04-25 ENCOUNTER — Ambulatory Visit: Payer: Medicare Other

## 2020-04-25 ENCOUNTER — Other Ambulatory Visit: Payer: Medicare Other

## 2020-04-26 ENCOUNTER — Ambulatory Visit
Admission: RE | Admit: 2020-04-26 | Discharge: 2020-04-26 | Disposition: A | Discharge status: Home / Self Care | Payer: Medicare Other | Source: Ambulatory Visit | Attending: Acute Care | Admitting: Acute Care

## 2020-04-26 ENCOUNTER — Other Ambulatory Visit: Payer: Self-pay

## 2020-04-26 DIAGNOSIS — G35 Multiple sclerosis: Secondary | ICD-10-CM

## 2020-04-26 MED ORDER — GADOBUTROL 1 MMOL/ML IV SOLN
10.0000 mL | Freq: Once | INTRAVENOUS | Status: AC | PRN
  Administered 2020-04-26: 16:00:00 10 mL via INTRAVENOUS

## 2021-05-22 ENCOUNTER — Encounter: Payer: Self-pay | Admitting: Emergency Medicine

## 2021-05-22 ENCOUNTER — Emergency Department: Payer: Medicare Other

## 2021-05-22 ENCOUNTER — Other Ambulatory Visit: Payer: Self-pay

## 2021-05-22 ENCOUNTER — Emergency Department
Admission: EM | Admit: 2021-05-22 | Discharge: 2021-05-22 | Disposition: A | Discharge status: Home / Self Care | Payer: Medicare Other | Attending: Emergency Medicine | Admitting: Emergency Medicine

## 2021-05-22 DIAGNOSIS — Z79899 Other long term (current) drug therapy: Secondary | ICD-10-CM | POA: Insufficient documentation

## 2021-05-22 DIAGNOSIS — E039 Hypothyroidism, unspecified: Secondary | ICD-10-CM | POA: Diagnosis not present

## 2021-05-22 DIAGNOSIS — S82851A Displaced trimalleolar fracture of right lower leg, initial encounter for closed fracture: Secondary | ICD-10-CM | POA: Insufficient documentation

## 2021-05-22 DIAGNOSIS — S99911A Unspecified injury of right ankle, initial encounter: Secondary | ICD-10-CM | POA: Diagnosis present

## 2021-05-22 DIAGNOSIS — Y92002 Bathroom of unspecified non-institutional (private) residence single-family (private) house as the place of occurrence of the external cause: Secondary | ICD-10-CM | POA: Insufficient documentation

## 2021-05-22 DIAGNOSIS — I10 Essential (primary) hypertension: Secondary | ICD-10-CM | POA: Insufficient documentation

## 2021-05-22 DIAGNOSIS — W182XXA Fall in (into) shower or empty bathtub, initial encounter: Secondary | ICD-10-CM | POA: Diagnosis not present

## 2021-05-22 DIAGNOSIS — F17210 Nicotine dependence, cigarettes, uncomplicated: Secondary | ICD-10-CM | POA: Insufficient documentation

## 2021-05-22 DIAGNOSIS — M25571 Pain in right ankle and joints of right foot: Secondary | ICD-10-CM | POA: Diagnosis not present

## 2021-05-22 MED ORDER — OXYCODONE-ACETAMINOPHEN 5-325 MG PO TABS
1.0000 | ORAL_TABLET | Freq: Once | ORAL | Status: AC
  Administered 2021-05-22: 1 via ORAL
  Filled 2021-05-22: qty 1

## 2021-05-22 MED ORDER — OXYCODONE-ACETAMINOPHEN 5-325 MG PO TABS
1.0000 | ORAL_TABLET | ORAL | Status: DC | PRN
  Administered 2021-05-22: 1 via ORAL
  Filled 2021-05-22 (×2): qty 1

## 2021-05-22 MED ORDER — OXYCODONE-ACETAMINOPHEN 5-325 MG PO TABS: 1.0000 | ORAL_TABLET | ORAL | 0 refills | Status: DC | PRN

## 2021-05-22 NOTE — Discharge Instructions (Signed)
Follow up with [facility], call for an appointment, Dr Okey Dupre wants to see you on Monday of next week Keep the ankle elevated Take the pain medication as needed Return if worsening

## 2021-05-22 NOTE — ED Provider Notes (Signed)
[facility] Emergency Department Provider Note  ____________________________________________   Event Date/Time   First MD Initiated Contact with Patient 05/22/21 [phone]     (approximate)  I have reviewed the triage vital signs and the nursing notes.   HISTORY  Chief Complaint Fall    HPI Melissa Hunter is a 65 y.o. female presents emergency department after a fall in her bathroom real early this morning.  States her ankle went one way and she went the other.  Patient last ate around 9 PM.  She denies head injury, LOC.  No other extremity injuries.  Does have some right knee pain.   Past Medical History:  Diagnosis Date   Family history of adverse reaction to anesthesia    PTS BROTHER-AGITATED, COMBATIVE   GERD (gastroesophageal reflux disease)    H/O   Headache     MIGRAINES-RARE   Hypertension    Hypothyroidism    Neuromuscular disorder (HCC)    MS   Sleep apnea    has cpap but has not used in a long time.    There are no problems to display for this patient.   Past Surgical History:  Procedure Laterality Date   BOTOX INJECTION N/A 04/08/2016   Procedure: BOTOX INJECTION;  Surgeon: Orson Ape, MD;  Location: [facility] ORS;  Service: Urology;  Laterality: N/A;   BOTOX INJECTION N/A 11/04/2016   Procedure: BOTOX INJECTION;  Surgeon: Orson Ape, MD;  Location: [facility] OR[facility];  Service: Urology;  Laterality: N/A;   CYSTOSCOPY N/A 04/08/2016   Procedure: CYSTOSCOPY;  Surgeon: Orson Ape, MD;  Location: [facility] ORS;  Service: Urology;  Laterality: N/A;   CYSTOSCOPY N/A 11/04/2016   Procedure: CYSTOSCOPY;  Surgeon: Orson Ape, MD;  Location: [facility] ORS;  Service: Urology;  Laterality: N/A;   STAPEDECTOMY      Prior to Admission medications   Medication Sig Start Date End Date Taking? Authorizing Provider  oxyCODONE-acetaminophen (PERCOCET) 5-325 MG tablet Take 1 tablet by mouth every 4 (four) hours as needed for severe pain. 05/22/21 05/22/22 Yes  Charlotte Brafford, Roselyn Bering, PA-C  atorvastatin (LIPITOR) 10 MG tablet Take 10 mg by mouth daily at 6 PM.    [provider]  baclofen (LIORESAL) 10 MG tablet Take 10 mg by mouth 2 (two) times daily.    [provider]  betamethasone valerate ointment (VALISONE) 0.1 % Apply 1 application topically every other day.    [provider]  Cholecalciferol (VITAMIN D) 2000 units tablet Take 2,000 Units by mouth daily.    [provider]  interferon beta-1a (AVONEX) 30 MCG injection Inject 30 mcg into the muscle every 7 (seven) days. EVERY Saturday EVENING    [provider]  Iodoquinol-HC-Aloe Polysacch (ALCORTIN A EX) Apply 1 application topically daily as needed (dermatitis).    [provider]  ketoconazole (NIZORAL) 2 % shampoo Apply 1 application topically once a week.    [provider]  levothyroxine (SYNTHROID, LEVOTHROID) 125 MCG tablet Take 125 mcg by mouth daily before breakfast.    [provider]  Melatonin 10 MG TBCR Take 10 mg by mouth at bedtime.     [provider]  meloxicam (MOBIC) 7.5 MG tablet Take 7.5 mg by mouth daily as needed for pain.    [provider]  modafinil (PROVIGIL) 100 MG tablet Take 100 mg by mouth daily as needed (fatigue).     [provider]  mupirocin ointment (BACTROBAN) 2 % Place 1  application into the nose daily as needed (dermatitis).    [provider]  naproxen sodium (ANAPROX) 220 MG tablet Take 220 mg by mouth daily as needed (pain).    [provider]  ramipril (ALTACE) 10 MG capsule Take 10 mg by mouth every morning.    [provider]  sertraline (ZOLOFT) 50 MG tablet Take 50 mg by mouth every morning.    [provider]  traZODone (DESYREL) 50 MG tablet Take 50 mg by mouth at bedtime as needed for sleep.    [provider]  triamcinolone cream (KENALOG) 0.5 % Apply 1 application topically daily as needed (dermatitis).     [provider]    Allergies Patient has no known allergies.  History reviewed. No pertinent family history.  Social History Social History   Tobacco Use   Smoking status: Every Day    Packs/day: 0.50    Years: 25.00    Pack years: 12.50    Types: Cigarettes   Smokeless tobacco: Never  Substance Use Topics   Alcohol use: Yes    Comment: 3 GLASSESS WINE/WEEK   Drug use: No    Review of Systems  Constitutional: No fever/chills Eyes: No visual changes. ENT: No sore throat. Respiratory: Denies cough Cardiovascular: Denies chest pain Gastrointestinal: Denies abdominal pain Genitourinary: Negative for dysuria. Musculoskeletal: Negative for back pain.  Positive for right ankle and knee pain Skin: Negative for rash. Psychiatric: no mood changes,     ____________________________________________   PHYSICAL EXAM:  VITAL SIGNS: ED Triage Vitals [05/22/21 0435]  Enc Vitals Group     BP (!) 157/103     Pulse Rate 72     Resp 16     Temp 98.6 F (37 C)     Temp Source Oral     SpO2 95 %     Weight      Height      Head Circumference      Peak Flow      Pain Score      Pain Loc      Pain Edu?      Excl. in GC?     Constitutional: Alert and oriented. Well appearing and in no acute distress. Eyes: Conjunctivae are normal.  Head: Atraumatic. Nose: No congestion/rhinnorhea. Mouth/Throat: Mucous membranes are moist.   Neck:  supple no lymphadenopathy noted Cardiovascular: Normal rate, regular rhythm.  Respiratory: Normal respiratory effort.  No retractions,  GU: deferred Musculoskeletal: Right ankle is in a EMS splint, neurovascular is intact, tenderness at medial and lateral malleolus, right knee is tender at the joint line  neurologic:  Normal speech and language.  Skin:  Skin is warm, dry and intact. No rash noted. Psychiatric: Mood and affect are normal. Speech and behavior are normal.  ____________________________________________   LABS (all  labs ordered are listed, but only abnormal results are displayed)  Labs Reviewed - No data to display ____________________________________________   ____________________________________________  RADIOLOGY  X-ray of the right ankle and right knee  ____________________________________________   PROCEDURES  Procedure(s) performed:   .Ortho Injury Treatment  Date/Time: 05/22/2021 12:47 PM Performed by: Faythe Ghee, PA-C Authorized by: Faythe Ghee, PA-C   Consent:    Consent obtained:  Verbal   Consent given by:  Patient   Risks discussed:  Nerve damage, restricted joint movement, vascular damage and stiffnessInjury location: ankle Location details: right ankle Injury type: fracture Fracture type: trimalleolar Pre-procedure neurovascular assessment: neurovascularly intact Pre-procedure distal perfusion: normal Pre-procedure  neurological function: normal Pre-procedure range of motion: normal  Anesthesia: Local anesthesia used: no  Patient sedated: NoManipulation performed: no Splint type: ankle stirrup and short leg Splint Applied by: ED Provider and ED Tech Supplies used: cotton padding, elastic bandage and Ortho-Glass Post-procedure neurovascular assessment: post-procedure neurovascularly intact Post-procedure distal perfusion: normal Post-procedure neurological function: normal Post-procedure range of motion: normal      ____________________________________________   INITIAL IMPRESSION / ASSESSMENT AND PLAN / ED COURSE  Pertinent labs & imaging results that were available during my care of the patient were reviewed by me and considered in my medical decision making (see chart for details).   The patient is a 65 year old female presents emergency department with right ankle injury and knee injury after a fall.  See HPI.  Physical exam is consistent with a fracture.  X-ray of the right ankle reviewed by me confirmed by radiology to have trimalleolar  fracture with mortise displacement.  X-ray of the right knee reviewed by me confirmed by radiology to be negative for fracture  Consult orthopedics, secure message and phone call sent to Dr. Okey Dupre  Dr. Okey Dupre states he would like for the swelling to decrease prior to taking the surgery.  He instructed me to splint the patient in a posterior slab along with a stirrup and angle of the foot medially to take the pressure off the medial malleolus.  Have her follow-up in the office most likely on Monday.  She is to call and make an appointment.  He will discuss surgical options on Monday.  Patient does have a blister but no broken skin upon splint application.  Extra padding was placed on the blister so the area would not rub against the skin  I did explain the treatment plan with the patient.  She is in agreement with treatment plan.  I did order the CT for Dr. Okey Dupre.  CT was performed prior to discharge.  Patient was given prescription for Percocet.  She is to elevate and ice the ankle.  Do not bear weight.  She was discharged in stable condition    Melissa Hunter was evaluated in Emergency Department on 05/22/2021 for the symptoms described in the history of present illness. She was evaluated in the context of the global COVID-19 pandemic, which necessitated consideration that the patient might be at risk for infection with the SARS-CoV-2 virus that causes COVID-19. Institutional protocols and algorithms that pertain to the evaluation of patients at risk for COVID-19 are in a state of rapid change based on information released by regulatory bodies including the CDC and federal and state organizations. These policies and algorithms were followed during the patient's care in the ED.    As part of my medical decision making, I reviewed the following data within the electronic MEDICAL RECORD NUMBER Nursing notes reviewed and incorporated, Old chart reviewed, Radiograph reviewed , A consult was requested  and obtained from this/these consultant(s) Orthopedics, Notes from prior ED visits, and  Controlled Substance Database  ____________________________________________   FINAL CLINICAL IMPRESSION(S) / ED DIAGNOSES  Final diagnoses:  Closed trimalleolar fracture of right ankle, initial encounter      NEW MEDICATIONS STARTED DURING THIS VISIT:  Discharge Medication List as of 05/22/2021  9:09 AM     START taking these medications   Details  oxyCODONE-acetaminophen (PERCOCET) 5-325 MG tablet Take 1 tablet by mouth every 4 (four) hours as needed for severe pain., Starting Wed 05/22/2021, Until Thu 05/22/2022 at 2359, Normal  Note:  This document was prepared using Dragon voice recognition software and may include unintentional dictation errors.    Faythe Ghee, PA-C 05/22/21 1250    Shaune Pollack, MD 05/22/21 [phone]

## 2021-05-22 NOTE — ED Triage Notes (Signed)
Pt arrived via [facility] from home where she slipped on wet floor this AM and hit her right ankle on bathtub. Area has significant swelling noted. Temporary splint in place. Pt denies hitting head as well as LOC.

## 2021-05-24 ENCOUNTER — Emergency Department
Admission: EM | Admit: 2021-05-24 | Discharge: 2021-05-30 | Disposition: A | Discharge status: Home / Self Care | Payer: Medicare Other | Attending: Emergency Medicine | Admitting: Emergency Medicine

## 2021-05-24 ENCOUNTER — Other Ambulatory Visit: Payer: Self-pay

## 2021-05-24 DIAGNOSIS — E039 Hypothyroidism, unspecified: Secondary | ICD-10-CM | POA: Diagnosis not present

## 2021-05-24 DIAGNOSIS — F17210 Nicotine dependence, cigarettes, uncomplicated: Secondary | ICD-10-CM | POA: Diagnosis not present

## 2021-05-24 DIAGNOSIS — Z20822 Contact with and (suspected) exposure to covid-19: Secondary | ICD-10-CM | POA: Diagnosis not present

## 2021-05-24 DIAGNOSIS — I10 Essential (primary) hypertension: Secondary | ICD-10-CM | POA: Insufficient documentation

## 2021-05-24 DIAGNOSIS — S90521A Blister (nonthermal), right ankle, initial encounter: Secondary | ICD-10-CM | POA: Insufficient documentation

## 2021-05-24 DIAGNOSIS — X58XXXA Exposure to other specified factors, initial encounter: Secondary | ICD-10-CM | POA: Insufficient documentation

## 2021-05-24 DIAGNOSIS — Z79899 Other long term (current) drug therapy: Secondary | ICD-10-CM | POA: Insufficient documentation

## 2021-05-24 DIAGNOSIS — S82851D Displaced trimalleolar fracture of right lower leg, subsequent encounter for closed fracture with routine healing: Secondary | ICD-10-CM | POA: Diagnosis not present

## 2021-05-24 DIAGNOSIS — S99911A Unspecified injury of right ankle, initial encounter: Secondary | ICD-10-CM | POA: Diagnosis present

## 2021-05-24 MED ORDER — FENTANYL CITRATE PF 50 MCG/ML IJ SOSY: 50.0000 ug | PREFILLED_SYRINGE | Freq: Once | INTRAMUSCULAR | Status: DC

## 2021-05-24 MED ORDER — ONDANSETRON 8 MG PO TBDP
8.0000 mg | ORAL_TABLET | Freq: Once | ORAL | Status: AC
Start: 2021-05-24 — End: 2021-05-24
  Administered 2021-05-24: 8 mg via ORAL
  Filled 2021-05-24: qty 1

## 2021-05-24 MED ORDER — FENTANYL CITRATE (PF) 250 MCG/5ML IJ SOLN
100.0000 ug | Freq: Once | INTRAMUSCULAR | Status: AC
  Administered 2021-05-24: 100 ug via INTRAMUSCULAR
  Filled 2021-05-24: qty 5

## 2021-05-24 NOTE — ED Provider Notes (Signed)
[facility] Emergency Department Provider Note  ____________________________________________  Time seen: Approximately 8:42 PM  I have reviewed the triage vital signs and the nursing notes.   HISTORY  Chief Complaint Ankle Pain    HPI BRIN RUGGERIO is a 65 y.o. female who presents the emergency department for reevaluation of her ankle.  Patient had a trimalleolar fracture that was splinted and is awaiting edema to go down before surgical repair.  Patient was seen in this department 2 days ago.  Had a blister that appeared to be a fracture blister from the description that was identified at the time.  Patient reports that she believes her ankle is swelling more, is now pressing against the splint causing more pain.  Patient states that the leg and ankle is dead weight given the weight of the splint and she is having a hard time ambulating.  Patient wanted to discuss options of home health versus rehab placement.  Patient denies any other complaints at this time.  She does have a history of sleep apnea, MS, hypothyroidism, hypertension, GERD       Past Medical History:  Diagnosis Date   Family history of adverse reaction to anesthesia    PTS BROTHER-AGITATED, COMBATIVE   GERD (gastroesophageal reflux disease)    H/O   Headache     MIGRAINES-RARE   Hypertension    Hypothyroidism    Neuromuscular disorder (HCC)    MS   Sleep apnea    has cpap but has not used in a long time.    There are no problems to display for this patient.   Past Surgical History:  Procedure Laterality Date   BOTOX INJECTION N/A 04/08/2016   Procedure: BOTOX INJECTION;  Surgeon: Orson Ape, MD;  Location: [facility] ORS;  Service: Urology;  Laterality: N/A;   BOTOX INJECTION N/A 11/04/2016   Procedure: BOTOX INJECTION;  Surgeon: Orson Ape, MD;  Location: [facility] ORS;  Service: Urology;  Laterality: N/A;   CYSTOSCOPY N/A 04/08/2016   Procedure: CYSTOSCOPY;  Surgeon: Orson Ape,  MD;  Location: [facility] ORS;  Service: Urology;  Laterality: N/A;   CYSTOSCOPY N/A 11/04/2016   Procedure: CYSTOSCOPY;  Surgeon: Orson Ape, MD;  Location: [facility] ORS;  Service: Urology;  Laterality: N/A;   STAPEDECTOMY      Prior to Admission medications   Medication Sig Start Date End Date Taking? Authorizing Provider  atorvastatin (LIPITOR) 10 MG tablet Take 10 mg by mouth daily at 6 PM.    [provider]  baclofen (LIORESAL) 10 MG tablet Take 10 mg by mouth 2 (two) times daily.    [provider]  betamethasone valerate ointment (VALISONE) 0.1 % Apply 1 application topically every other day.    [provider]  Cholecalciferol (VITAMIN D) 2000 units tablet Take 2,000 Units by mouth daily.    [provider]  interferon beta-1a (AVONEX) 30 MCG injection Inject 30 mcg into the muscle every 7 (seven) days. EVERY Saturday EVENING    [provider]  Iodoquinol-HC-Aloe Polysacch (ALCORTIN A EX) Apply 1 application topically daily as needed (dermatitis).    [provider]  ketoconazole (NIZORAL) 2 % shampoo Apply 1 application topically once a week.    [provider]  levothyroxine (SYNTHROID, LEVOTHROID) 125 MCG tablet Take 125 mcg by mouth daily before breakfast.    [provider]  Melatonin 10 MG TBCR Take 10 mg by mouth at bedtime.     [provider]  meloxicam (MOBIC) 7.5 MG tablet Take 7.5 mg by mouth daily as needed for pain.    [provider]  modafinil (PROVIGIL) 100 MG tablet Take 100 mg by mouth daily as needed (fatigue).     [provider]  mupirocin ointment (BACTROBAN) 2 % Place 1 application into the nose daily as needed (dermatitis).    [provider]  naproxen sodium (ANAPROX) 220 MG tablet Take 220 mg by mouth daily as needed (pain).    [provider]  oxyCODONE-acetaminophen (PERCOCET) 5-325 MG tablet Take 1 tablet by mouth every 4 (four) hours as needed for  severe pain. 05/22/21 05/22/22  Fisher, Roselyn Bering, PA-C  ramipril (ALTACE) 10 MG capsule Take 10 mg by mouth every morning.    [provider]  sertraline (ZOLOFT) 50 MG tablet Take 50 mg by mouth every morning.    [provider]  traZODone (DESYREL) 50 MG tablet Take 50 mg by mouth at bedtime as needed for sleep.    [provider]  triamcinolone cream (KENALOG) 0.5 % Apply 1 application topically daily as needed (dermatitis).    [provider]    Allergies Patient has no known allergies.  No family history on file.  Social History Social History   Tobacco Use   Smoking status: Every Day    Packs/day: 0.50    Years: 25.00    Pack years: 12.50    Types: Cigarettes   Smokeless tobacco: Never  Substance Use Topics   Alcohol use: Yes    Comment: 3 GLASSESS WINE/WEEK   Drug use: No     Review of Systems  Constitutional: No fever/chills Eyes: No visual changes. No discharge ENT: No upper respiratory complaints. Cardiovascular: no chest pain. Respiratory: no cough. No SOB. Gastrointestinal: No abdominal pain.  No nausea, no vomiting.  No diarrhea.  No constipation. Musculoskeletal: Pain to the right ankle, known fracture, ankle splinted at this time Skin: Negative for rash, abrasions, lacerations, ecchymosis. Neurological: Negative for headaches, focal weakness or numbness.  10 System ROS otherwise negative.  ____________________________________________   PHYSICAL EXAM:  VITAL SIGNS: ED Triage Vitals [05/24/21 1753]  Enc Vitals Group     BP (!) 142/89     Pulse Rate 86     Resp 16     Temp 98.6 F (37 C)     Temp Source Oral     SpO2 100 %     Weight 248 lb 0.3 oz (112.5 kg)     Height 5\' 8"  (1.727 m)     Head Circumference      Peak Flow      Pain Score 10     Pain Loc      Pain Edu?      Excl. in GC?      Constitutional: Alert and oriented. Well appearing and in no acute distress. Eyes: Conjunctivae are normal.  PERRL. EOMI. Head: Atraumatic. ENT:      Ears:       Nose: No congestion/rhinnorhea.      Mouth/Throat: Mucous membranes are moist.  Neck: No stridor.    Cardiovascular: Normal rate, regular rhythm. Normal S1 and S2.  Good peripheral circulation. Respiratory: Normal respiratory effort without tachypnea or retractions. Lungs CTAB. Good air entry to the bases with no decreased or absent breath sounds. Musculoskeletal: Full range of motion to all extremities. No gross deformities appreciated.  Visualization of the right lower extremity reveals multiple fracture blisters about the ankle.  No erythema or  edema concerning for cellulitic changes.  It appears that edema, blisters were pressing firmly against the splint likely causing her additional pain.  Sensation, capillary refill intact all digits.  There is no erythema, edema or tenderness of the calf.  See below pictures for fracture blisters Neurologic:  Normal speech and language. No gross focal neurologic deficits are appreciated.  Skin:  Skin is warm, dry and intact. No rash noted. Psychiatric: Mood and affect are normal. Speech and behavior are normal. Patient exhibits appropriate insight and judgement.      ____________________________________________   LABS (all labs ordered are listed, but only abnormal results are displayed)  Labs Reviewed  RESP PANEL BY RT-PCR (FLU A&B, COVID) [id number]  URINALYSIS, ROUTINE W REFLEX MICROSCOPIC  COMPREHENSIVE METABOLIC PANEL  CBC WITH DIFFERENTIAL/PLATELET  CK   ____________________________________________  EKG   ____________________________________________  RADIOLOGY  Patient's imaging from 05/22/2021 was reviewed today, concur with widening of the ankle mortise joint as well as trimalleolar fracture  ____________________________________________    PROCEDURES  Procedure(s) performed:    .Splint Application  Date/Time: 05/24/2021 9:19 PM Performed by: Racheal Patches,  PA-C Authorized by: Racheal Patches, PA-C   Consent:    Consent obtained:  Verbal   Consent given by:  Patient   Risks, benefits, and alternatives were discussed: yes     Risks discussed:  Discoloration, numbness, pain and swelling   Alternatives discussed:  Delayed treatment Universal protocol:    Procedure explained and questions answered to patient or proxy's satisfaction: yes     Immediately prior to procedure a time out was called: yes     Patient identity confirmed:  Verbally with patient Pre-procedure details:    Distal neurologic exam:  Normal   Distal perfusion: distal pulses strong and brisk capillary refill   Procedure details:    Location:  Ankle   Ankle location:  R ankle   Splint type:  Short leg and ankle stirrup   Supplies:  Cotton padding, fiberglass and elastic bandage Post-procedure details:    Distal neurologic exam:  Normal   Distal perfusion: distal pulses strong and brisk capillary refill     Procedure completion:  Tolerated well, no immediate complications   Post-procedure imaging: not applicable      Medications  fentaNYL citrate (PF) (SUBLIMAZE) injection 100 mcg (100 mcg Intramuscular Given 05/24/21 2223)  ondansetron (ZOFRAN-ODT) disintegrating tablet 8 mg (8 mg Oral Given 05/24/21 2222)     ____________________________________________   INITIAL IMPRESSION / ASSESSMENT AND PLAN / ED COURSE  Pertinent labs & imaging results that were available during my care of the patient were reviewed by me and considered in my medical decision making (see chart for details).  Review of the [place] CSRS was performed in accordance of the [facility] prior to dispensing any controlled drugs.           Patient's diagnosis is consistent with closed trimalleolar fracture, fracture blisters.  Patient presented to the emergency department from home.  She states that she had a severe trimalleolar fracture 2 days ago, was discharged that she thought she could take over  self at home.  She states that she cannot ambulate, get out of bed.  She is not been able to ambulate to the restroom, eat.  She states that she started having creased pain/pressure to the ankle presented for evaluation.  Patient does have fracture blisters at this time.  No evidence of cellulitis.  Patient will have a new splint placed which provides significant comfort  relief for the patient.  However she cannot ambulate at home, patient will need placement at this time.  Patient will be boarding in the emergency department.  I have ordered basic labs, ordered consult for social work and PT..  If labs returned with concerning findings, or patient is unable to be managed in regards to her pain in the ED, can consider admission at that time.  At this time I discussed the patient's presentation, treatment and plan with attending provider, Dr. Katrinka Blazing as the patient will be placed in one of the major side rooms as the section of the emergency department was closing.    ____________________________________________  FINAL CLINICAL IMPRESSION(S) / ED DIAGNOSES  Final diagnoses:  Closed trimalleolar fracture of right ankle with routine healing, subsequent encounter  Blister of right ankle, initial encounter      NEW MEDICATIONS STARTED DURING THIS VISIT:  ED Discharge Orders     None           This chart was dictated using voice recognition software/Dragon. Despite best efforts to proofread, errors can occur which can change the meaning. Any change was purely unintentional.    Racheal Patches, PA-C 05/24/21 2331    Delton Pra[place], MD 05/25/21 [phone]

## 2021-05-24 NOTE — ED Triage Notes (Addendum)
Pt to ER via [facility] with complaints of right ankle pain. Reports falling on Wednesday, being told she had three fractures on her ankle, and being placed in a splint. Pt returns today with worsening pain. Also reports increased urination that started Wednesday. Denies burning.   Reports splint is digging into her ankle that is causing excruciating pain, her leg is dead weight, and she is unable to move around safely. States she has been taking oxycodone as prescribed.

## 2021-05-24 NOTE — ED Notes (Signed)
Here via EMS from home with c/o right ankle fx pain and swelling diagnosed here 2 days ago, hx of htn.

## 2021-05-25 LAB — COMPREHENSIVE METABOLIC PANEL
ALT: 13 U/L (ref 0–44)
AST: 17 U/L (ref 15–41)
Albumin: 3.7 g/dL (ref 3.5–5.0)
Alkaline Phosphatase: 47 U/L (ref 38–126)
Anion gap: 7 (ref 5–15)
BUN: 16 mg/dL (ref 8–23)
CO2: 29 mmol/L (ref 22–32)
Calcium: 8.9 mg/dL (ref 8.9–10.3)
Chloride: 101 mmol/L (ref 98–111)
Creatinine, Ser: 1.12 mg/dL — ABNORMAL HIGH (ref 0.44–1.00)
GFR, Estimated: 55 mL/min — ABNORMAL LOW (ref >60)
Glucose, Bld: 105 mg/dL — ABNORMAL HIGH (ref 70–99)
Potassium: 4.3 mmol/L (ref 3.5–5.1)
Sodium: 137 mmol/L (ref 135–145)
Total Bilirubin: 1.3 mg/dL — ABNORMAL HIGH (ref 0.3–1.2)
Total Protein: 6.8 g/dL (ref 6.5–8.1)

## 2021-05-25 LAB — CBC WITH DIFFERENTIAL/PLATELET
Abs Immature Granulocytes: 0.01 10*3/uL (ref 0.00–0.07)
Basophils Absolute: 0 10*3/uL (ref 0.0–0.1)
Basophils Relative: 1 %
Eosinophils Absolute: 0.1 10*3/uL (ref 0.0–0.5)
Eosinophils Relative: 2 %
HCT: 39.8 % (ref 36.0–46.0)
Hemoglobin: 13.1 g/dL (ref 12.0–15.0)
Immature Granulocytes: 0 %
Lymphocytes Relative: 24 %
Lymphs Abs: 1.3 10*3/uL (ref 0.7–4.0)
MCH: 29.8 pg (ref 26.0–34.0)
MCHC: 32.9 g/dL (ref 30.0–36.0)
MCV: 90.5 fL (ref 80.0–100.0)
Monocytes Absolute: 0.6 10*3/uL (ref 0.1–1.0)
Monocytes Relative: 10 %
Neutro Abs: 3.5 10*3/uL (ref 1.7–7.7)
Neutrophils Relative %: 63 %
Platelets: 169 10*3/uL (ref 150–400)
RBC: 4.4 MIL/uL (ref 3.87–5.11)
RDW: 13.2 % (ref 11.5–15.5)
WBC: 5.6 10*3/uL (ref 4.0–10.5)
nRBC: 0 % (ref 0.0–0.2)

## 2021-05-25 LAB — CK: Total CK: 229 U/L (ref 38–234)

## 2021-05-25 LAB — URINALYSIS, ROUTINE W REFLEX MICROSCOPIC
Bacteria, UA: NONE SEEN
Bilirubin Urine: NEGATIVE
Glucose, UA: NEGATIVE mg/dL
Ketones, ur: NEGATIVE mg/dL
Nitrite: POSITIVE — AB
Protein, ur: NEGATIVE mg/dL
Specific Gravity, Urine: 1.013 (ref 1.005–1.030)
pH: 5 (ref 5.0–8.0)

## 2021-05-25 LAB — RESP PANEL BY RT-PCR (FLU A&B, COVID) ARPGX2
Influenza A by PCR: NEGATIVE
Influenza B by PCR: NEGATIVE
SARS Coronavirus 2 by RT PCR: NEGATIVE

## 2021-05-25 MED ORDER — OXYCODONE-ACETAMINOPHEN 5-325 MG PO TABS
1.0000 | ORAL_TABLET | ORAL | Status: DC | PRN
  Administered 2021-05-25 – 2021-05-30 (×15): 1 via ORAL
  Filled 2021-05-25 (×16): qty 1

## 2021-05-25 MED ORDER — SERTRALINE HCL 50 MG PO TABS
100.0000 mg | ORAL_TABLET | Freq: Every day | ORAL | Status: DC
  Administered 2021-05-25 – 2021-05-29 (×5): 100 mg via ORAL
  Filled 2021-05-25 (×5): qty 2

## 2021-05-25 MED ORDER — RAMIPRIL 10 MG PO CAPS
10.0000 mg | ORAL_CAPSULE | Freq: Two times a day (BID) | ORAL | Status: DC
  Administered 2021-05-25 – 2021-05-30 (×10): 10 mg via ORAL
  Filled 2021-05-25 (×12): qty 1

## 2021-05-25 MED ORDER — BACLOFEN 10 MG PO TABS
10.0000 mg | ORAL_TABLET | Freq: Two times a day (BID) | ORAL | Status: DC
  Administered 2021-05-25 – 2021-05-30 (×10): 10 mg via ORAL
  Filled 2021-05-25 (×12): qty 1

## 2021-05-25 MED ORDER — CEPHALEXIN 500 MG PO CAPS
500.0000 mg | ORAL_CAPSULE | Freq: Three times a day (TID) | ORAL | Status: DC
  Administered 2021-05-25 – 2021-05-30 (×14): 500 mg via ORAL
  Filled 2021-05-25 (×14): qty 1

## 2021-05-25 MED ORDER — ATORVASTATIN CALCIUM 20 MG PO TABS
20.0000 mg | ORAL_TABLET | Freq: Every day | ORAL | Status: DC
  Administered 2021-05-25 – 2021-05-29 (×5): 20 mg via ORAL
  Filled 2021-05-25 (×5): qty 1

## 2021-05-25 MED ORDER — LEVOTHYROXINE SODIUM 50 MCG PO TABS
125.0000 ug | ORAL_TABLET | Freq: Every day | ORAL | Status: DC
  Administered 2021-05-26 – 2021-05-30 (×5): 125 ug via ORAL
  Filled 2021-05-25 (×4): qty 3

## 2021-05-25 NOTE — ED Notes (Signed)
Pt given breakfast tray. Pt also given warm blanket, pt phone to use, and a black coffee.

## 2021-05-25 NOTE — TOC Initial Note (Signed)
Transition of Care [facility]) - Initial/Assessment Note    Patient Details  Name: Melissa Hunter MRN: [id number] Date of Birth: [birth year]  Transition of Care [facility]) CM/SW Contact:    Verna Czech [place], [place] Phone Number: [phone] 05/25/2021, 3:42 PM  Clinical Narrative:                 Patient is a 65 year old woman who presented to the ED with a fractured ankle. Per patient , she slipped in the bathroom. Patient resides with her significant other, his assistance in limited due to his own medical issues. Patient has a cain, walker and crutches at home. Patient's PCP is Dr. Arlana Pouch in [place] and she receives her medications through mail order or [facility]. Patient is agreeable to SNF and does not have a facility preference. SNF process discussed, Fl2 completed and faxed out for bed offers. This Child psychotherapist will follow up with patient to review bed offers when received.   Transition of Care to continue to follow  [facility], LCSW Transition of Care [phone]   Expected Discharge Plan: Skilled Nursing Facility Barriers to Discharge: Continued Medical Work up   Patient Goals and CMS Choice Patient states their goals for this hospitalization and ongoing recovery are:: "To get my ankle stronger"      Expected Discharge Plan and Services Expected Discharge Plan: Skilled Nursing Facility In-house Referral: Clinical Social Work     Living arrangements for the past 2 months: Single Family Home                                      Prior Living Arrangements/Services Living arrangements for the past 2 months: Single Family Home Lives with:: Roommate   Do you feel safe going back to the place where you live?: Yes        Care giver support system in place?: Yes (comment)   Criminal Activity/Legal Involvement Pertinent to Current Situation/Hospitalization: No - Comment as needed  Activities of Daily Living      Permission Sought/Granted                   Emotional Assessment Appearance:: Appears stated age Attitude/Demeanor/Rapport: Engaged Affect (typically observed): Accepting, Adaptable Orientation: : Oriented to Self, Oriented to Place, Oriented to  Time, Oriented to Situation Alcohol / Substance Use: Not Applicable Psych Involvement: Yes (comment)  Admission diagnosis:  Right Ankle Pain  EMS There are no problems to display for this patient.  PCP:  Jaclyn Shaggy, MD Pharmacy:   [facility] [phone] [place], [place] - [address] AT [facility] [address] [place] [address] Phone: [phone] Fax: [phone]     Social Determinants of Health (SDOH) Interventions    Readmission Risk Interventions No flowsheet data found.

## 2021-05-25 NOTE — Evaluation (Signed)
Physical Therapy Evaluation Patient Details Name: Melissa Hunter MRN: [id number] DOB: [birth year] Today's Date: 05/25/2021  History of Present Illness  Pt is a 65 y/o F who presented to the ED on 05/24/21 with c/o worsening R ankle pain. Pt was evaluated in the ED on 10/19 after falling in her bathroom & X-ray of the right ankle revealed trimalleolar fracture with mortise displacement. Splint was placed & pt was instructed to f/u as outpatient as swelling decreased. 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 Pt anxious & emotional re: situation but actually mobilizes well. Pt is mostly limited by pain with RLE movement. Pt is able to complete bed mobility with min assist & sit>stand & stand pivot from elevated surface with RW & CGA. Pt does endorse fatigue & pain that limit mobility. At this time pt would benefit from STR upon d/c to maximize independence with functional mobility & reduce fall risk prior to return home.      Recommendations for follow up therapy are one component of a multi-disciplinary discharge planning process, led by the attending physician.  Recommendations may be updated based on patient status, additional functional criteria and insurance authorization.  Follow Up Recommendations SNF;Supervision/Assistance - 24 hour    Equipment Recommendations  Wheelchair (measurements PT);Wheelchair cushion (measurements PT);Rolling walker with 5" wheels;3in1 (PT) (elevating leg rests for w/c (anticipate 20x20))    Recommendations for Other Services       Precautions / Restrictions Precautions Precautions: Fall Restrictions Weight Bearing Restrictions: Yes RLE Weight Bearing: Non weight bearing      Mobility  Bed Mobility Overal bed mobility: Needs Assistance Bed Mobility: Supine to Sit      Supine to sit: Min assist;HOB elevated (to maneuver RLE to EOB, extra time to complete movement)          Transfers Overall transfer level: Needs assistance Equipment used: Rolling walker (2 wheeled) Transfers: Sit to/from [employer] Sit to Stand: Min guard Stand pivot transfers: Min guard (pt able to scoot foot along floor to pivot to recliner but doesn't fully turn to chair before sitting)       General transfer comment: education/cuing for hand placement & sequencing  Ambulation/Gait                Stairs            Wheelchair Mobility    Modified Rankin (Stroke Patients Only)       Balance Overall balance assessment: Needs assistance Sitting-balance support: Feet supported;No upper extremity supported Sitting balance-Leahy Scale: Good     Standing balance support: Bilateral upper extremity supported;During functional activity Standing balance-Leahy Scale: Poor Standing balance comment: CGA for balance with RW while standing & maintaining NWB RLE                             Pertinent Vitals/Pain Pain Assessment: 0-10 Pain Score: 8  Pain Location: RLE with movement Pain Descriptors / Indicators: Grimacing;Discomfort Pain Intervention(s): Repositioned;Monitored during session;Patient requesting pain meds-RN notified    Home Living Family/patient expects to be discharged to:: Private residence Living Arrangements: Spouse/significant other Available Help at Discharge:  (none; 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" & is currently "not doing well" & receiving HHPT)  Type of Home: House Home Access: Ramped entrance     Home Layout: One level Home Equipment: Cane - single point      Prior Function Level of Independence: Independent         Comments: Prior to fall & ankle fx pt was working from home, independent, driving, caregiver for significant other who just had "spine surgery"      Hand Dominance        Extremity/Trunk Assessment   Upper Extremity Assessment Upper Extremity Assessment: Generalized weakness    Lower Extremity Assessment Lower Extremity Assessment: Generalized weakness (RLE not formally tested)       Communication   Communication: No difficulties  Cognition Arousal/Alertness: Awake/alert Behavior During Therapy: WFL for tasks assessed/performed;Anxious Overall Cognitive Status: Within Functional Limits for tasks assessed                                        General Comments      Exercises     Assessment/Plan    PT Assessment Patient needs continued PT services  PT Problem List Decreased strength;Decreased coordination;Pain;Decreased activity tolerance;Decreased balance;Decreased mobility;Decreased knowledge of precautions;Decreased safety awareness;Obesity       PT Treatment Interventions DME instruction;Therapeutic exercise;Gait training;Balance training;Manual techniques;Wheelchair mobility training;Neuromuscular re-education;Modalities;Functional mobility training;Therapeutic activities;Patient/family education    PT Goals (Current goals can be found in the Care Plan section)  Acute Rehab PT Goals Patient Stated Goal: decreased pain PT Goal Formulation: With patient Time For Goal Achievement: 06/08/21 Potential to Achieve Goals: Good    Frequency 7X/week   Barriers to discharge Decreased caregiver support      Co-evaluation               AM-PAC PT "6 Clicks" Mobility  Outcome Measure Help needed turning from your back to your side while in a flat bed without using bedrails?: A Little Help needed moving from lying on your back to sitting on the side of a flat bed without using bedrails?: A Little Help needed moving to and from a bed to a chair (including a wheelchair)?: A Little Help needed standing up from a chair using your arms (e.g., wheelchair or bedside chair)?: A Little Help needed  to walk in hospital room?: Total Help needed climbing 3-5 steps with a railing? : Total 6 Click Score: 14    End of Session   Activity Tolerance: Patient tolerated treatment well Patient left: in chair;with call bell/phone within reach Nurse Communication: Mobility status PT Visit Diagnosis: Unsteadiness on feet (R26.81);Muscle weakness (generalized) (M62.81);Difficulty in walking, not elsewhere classified (R26.2);Pain Pain - Right/Left: Right Pain - part of body: Leg;Ankle and joints of foot    Time: 3710-6269 PT Time Calculation (min) (ACUTE ONLY): 18 min   Charges:   PT Evaluation $PT Eval Low Complexity: 1 Low PT Treatments $Therapeutic Activity: 8-22 mins        Aleda Grana, PT, DPT 05/25/21, 3:14 PM   Sandi Mariscal 05/25/2021, 3:13 PM

## 2021-05-25 NOTE — ED Notes (Signed)
MD aware of need for daily meds to be reordered. Pt also requests pain medication, MD aware.

## 2021-05-25 NOTE — ED Notes (Signed)
Pt resting comfortably at this time. Normal rise and fall of chest. NAD noted. Purewick ion place. Call bel in reach.

## 2021-05-25 NOTE — ED Notes (Signed)
Pt given lunch tray.

## 2021-05-25 NOTE — ED Notes (Signed)
Pt sitting in the recliner at this time. States that her pain is better at this time.

## 2021-05-25 NOTE — ED Notes (Signed)
PT at bedside.

## 2021-05-25 NOTE — ED Notes (Signed)
Pt up OOB to bedside recliner.   Pt given warm blanket. Pt given black coffee per request. Pt c/o of pain, will ask for pain medication. Call bell in reach.

## 2021-05-25 NOTE — [place] FL2 (Signed)
MEDICAID FL2 LEVEL OF CARE SCREENING TOOL     IDENTIFICATION  Patient Name: Melissa Hunter Birthdate: 08/14/1955 Sex: female Admission Date (Current Location): 05/24/2021  [place] Number:  Chiropodist and Address:  [facility], [address] [place], [place] [address]      Provider Number:    Attending Physician Name and Address:  No att. providers found  Relative Name and Phone Number:       Current Level of Care: Hospital Recommended Level of Care: Skilled Nursing Facility Prior Approval Number:    Date Approved/Denied:   PASRR Number: [id number]  Discharge Plan: SNF    Current Diagnoses: There are no problems to display for this patient.   Orientation RESPIRATION BLADDER Height & Weight     Self, Time, Situation, Place  Normal External catheter (purewick) Weight: 248 lb 0.3 oz (112.5 kg) Height:  5\' 8"  (172.7 cm)  BEHAVIORAL SYMPTOMS/MOOD NEUROLOGICAL BOWEL NUTRITION STATUS      Continent Diet  AMBULATORY STATUS COMMUNICATION OF NEEDS Skin   Limited Assist Verbally Normal                       Personal Care Assistance Level of Assistance  Bathing, Feeding, Dressing Bathing Assistance: Limited assistance Feeding assistance: Independent Dressing Assistance: Limited assistance     Functional Limitations Info  Sight, Hearing, Speech Sight Info: Adequate Hearing Info: Adequate Speech Info: Adequate    SPECIAL CARE FACTORS FREQUENCY  PT (By licensed PT), OT (By licensed OT)     PT Frequency: 5x per week OT Frequency: 5x per week            Contractures Contractures Info: Not present    Additional Factors Info                  Current Medications (05/25/2021):  This is the current hospital active medication list Current Facility-Administered Medications  Medication Dose Route Frequency Provider Last Rate Last Admin   atorvastatin (LIPITOR) tablet 20 mg  20 mg Oral QHS  05/27/2021, MD       baclofen (LIORESAL) tablet 10 mg  10 mg Oral BID Concha Se, MD       [START ON 05/26/2021] levothyroxine (SYNTHROID) tablet 125 mcg  125 mcg Oral QAC breakfast 05/28/2021, MD       oxyCODONE-acetaminophen (PERCOCET/ROXICET) 5-325 MG per tablet 1 tablet  1 tablet Oral Q4H PRN Concha Se, MD       ramipril (ALTACE) capsule 10 mg  10 mg Oral BID Concha Se, MD       sertraline (ZOLOFT) tablet 100 mg  100 mg Oral QHS Concha Se, MD       Current Outpatient Medications  Medication Sig Dispense Refill   atorvastatin (LIPITOR) 20 MG tablet Take 20 mg by mouth at bedtime.     baclofen (LIORESAL) 10 MG tablet Take 10 mg by mouth 2 (two) times daily.     Cholecalciferol (VITAMIN D) 2000 units tablet Take 2,000 Units by mouth daily.     levothyroxine (SYNTHROID, LEVOTHROID) 125 MCG tablet Take 125 mcg by mouth daily before breakfast.     Multiple Vitamins-Minerals (MULTIVITAMIN WITH MINERALS) tablet Take 1 tablet by mouth daily.     oxyCODONE-acetaminophen (PERCOCET) 5-325 MG tablet Take 1 tablet by mouth every 4 (four) hours as needed for severe pain. 20 tablet 0   ramipril (ALTACE) 10 MG capsule  Take 10 mg by mouth 2 (two) times daily.     sertraline (ZOLOFT) 100 MG tablet Take 100 mg by mouth at bedtime.       Discharge Medications: Please see discharge summary for a list of discharge medications.  Relevant Imaging Results:  Relevant Lab Results:   Additional Information Soc Sec [id number]  Verna Czech [place], [place]

## 2021-05-26 NOTE — ED Notes (Signed)
Pt elected not to wear BP cuff or oxygen saturation probe over night.

## 2021-05-26 NOTE — Progress Notes (Signed)
Physical Therapy Treatment Patient Details Name: Melissa Hunter MRN: [id number] DOB: [birth year] Today's Date: 05/26/2021   History of Present Illness Pt is a 65 y/o F who presented to the ED on 05/24/21 with c/o worsening R ankle pain. Pt was evaluated in the ED on 10/19 after falling in her bathroom & X-ray of the right ankle revealed trimalleolar fracture with mortise displacement. Splint was placed & pt was instructed to f/u as outpatient as swelling decreased. PMH: GERD, HA, HTN, MS, sleep apnea    PT Comments    Pt seen for PT tx with pt agreeable. PT assisted pt with donning brief as pt notes urinary incontinence. Pt is able to roll L<>R with mod I for brief placement and transfer to sitting EOB with supervision on this date. Pt continues to require CGA<>min assist for sit>stand with pt experiencing LOB x 2 (once when attempting sit>stand from EOB, once when attempting sit>stand from recliner due to decreased ability to weight shift anteriorly). Pt demonstrates improving ability to pivot to recliner and is able to progress to taking 2 small steps forward with RW with min assist. Pt continues to be limited by increased RLE pain & fatigue with mobility. Continue to recommend STR upon d/c to maximize independence with functional mobility & reduce fall risk prior to return home.     Recommendations for follow up therapy are one component of a multi-disciplinary discharge planning process, led by the attending physician.  Recommendations may be updated based on patient status, additional functional criteria and insurance authorization.  Follow Up Recommendations  SNF;Supervision/Assistance - 24 hour     Equipment Recommendations  Wheelchair (measurements PT);Wheelchair cushion (measurements PT);Rolling walker with 5" wheels;3in1 (PT) (anticipate 20x20 w/c with elevating leg rests)    Recommendations for Other Services       Precautions / Restrictions Precautions Precautions:  Fall Restrictions Weight Bearing Restrictions: Yes RLE Weight Bearing: Non weight bearing     Mobility  Bed Mobility Overal bed mobility: Needs Assistance Bed Mobility: Rolling;Supine to Sit;Sit to Supine Rolling: Modified independent (Device/Increase time)   Supine to sit: Supervision;HOB elevated Sit to supine: Supervision;HOB elevated   General bed mobility comments: able to transfer supine<>sit without assistance today, using UE to assist RLE on/off of bed    Transfers Overall transfer level: Needs assistance Equipment used: Rolling walker (2 wheeled) Transfers: Sit to/from [employer] Sit to Stand: Min assist;Min guard Stand pivot transfers: Min guard;Min assist       General transfer comment: Pt requires extra time & min cuing to recall correct hand placement during sit>stand. Cuing for technique for pivot to recliner with RW.  Ambulation/Gait         Gait velocity: decreased   General Gait Details: Pt able to initiate stepping forward with LLE after PT provides demo/education/instruction. Pt is able to take 2 small steps fowards & 1 backwards before requiring seated rest break 2/2 pain/fatigue.   Stairs             Wheelchair Mobility    Modified Rankin (Stroke Patients Only)       Balance Overall balance assessment: Needs assistance Sitting-balance support: Feet supported Sitting balance-Leahy Scale: Good     Standing balance support: Bilateral upper extremity supported;During functional activity Standing balance-Leahy Scale: Poor                              Cognition Arousal/Alertness: Awake/alert Behavior During Therapy: [facility]  for tasks assessed/performed;Anxious Overall Cognitive Status: Within Functional Limits for tasks assessed                                        Exercises      General Comments        Pertinent Vitals/Pain Pain Assessment: Faces Faces Pain Scale: Hurts whole  lot Pain Location: RLE in dependent position, c/o pain across posterior mid calf Pain Descriptors / Indicators: Grimacing;Discomfort Pain Intervention(s): Monitored during session;Limited activity within patient's tolerance;Repositioned    Home Living Family/patient expects to be discharged to:: Private residence                    Prior Function            PT Goals (current goals can now be found in the care plan section) Acute Rehab PT Goals Patient Stated Goal: decreased pain PT Goal Formulation: With patient Time For Goal Achievement: 06/08/21 Potential to Achieve Goals: Good Progress towards PT goals: Progressing toward goals    Frequency    7X/week      PT Plan Current plan remains appropriate    Co-evaluation              AM-PAC PT "6 Clicks" Mobility   Outcome Measure  Help needed turning from your back to your side while in a flat bed without using bedrails?: None Help needed moving from lying on your back to sitting on the side of a flat bed without using bedrails?: A Little Help needed moving to and from a bed to a chair (including a wheelchair)?: A Little Help needed standing up from a chair using your arms (e.g., wheelchair or bedside chair)?: A Little Help needed to walk in hospital room?: A Lot Help needed climbing 3-5 steps with a railing? : Total 6 Click Score: 16    End of Session   Activity Tolerance: Patient tolerated treatment well;Patient limited by pain;Patient limited by fatigue Patient left: in chair;with call bell/phone within reach   PT Visit Diagnosis: Unsteadiness on feet (R26.81);Muscle weakness (generalized) (M62.81);Difficulty in walking, not elsewhere classified (R26.2);Pain Pain - Right/Left: Right Pain - part of body: Leg;Ankle and joints of foot     Time: 6644-0347 PT Time Calculation (min) (ACUTE ONLY): 14 min  Charges:  $Therapeutic Activity: 8-22 mins                     Aleda Grana, PT,  DPT 05/26/21, 12:04 PM    Sandi Mariscal 05/26/2021, 12:01 PM

## 2021-05-27 NOTE — ED Notes (Signed)
Pt resting with eyes closed, respirations even and unlabored.  Legs resting on pillow.

## 2021-05-27 NOTE — ED Notes (Signed)
Urine cannister emptied at , purewick changed. Pt medicated & coffee provided per request.

## 2021-05-27 NOTE — Progress Notes (Signed)
Physical Therapy Treatment Patient Details Name: Melissa Hunter MRN: [id number] DOB: 07-03-1956 Today's Date: 05/27/2021   History of Present Illness Pt is a 65 y/o F who presented to the ED on 05/24/21 with c/o worsening R ankle pain. Pt was evaluated in the ED on 10/19 after falling in her bathroom & X-ray of the right ankle revealed trimalleolar fracture with mortise displacement. Splint was placed & pt was instructed to f/u as outpatient as swelling decreased. PMH: GERD, HA, HTN, MS, sleep apnea    PT Comments    Pt seen for PT tx with pt agreeable. Pt continues to require min assist overall for transfers & gait, as well as encouragement to attempt gait & sit in recliner during the day. Pt is able to ambulate 3 ft + 5 ft with RW but limited by fatigue & pain. Continue to recommend STR upon d/c to maximize independence with functional mobility & reduce fall risk prior to return home.    Recommendations for follow up therapy are one component of a multi-disciplinary discharge planning process, led by the attending physician.  Recommendations may be updated based on patient status, additional functional criteria and insurance authorization.  Follow Up Recommendations  Skilled nursing-short term rehab (<3 hours/day)     Assistance Recommended at Discharge Frequent or constant Supervision/Assistance  Equipment Recommendations  Wheelchair cushion (measurements PT);Rollator (4 wheels);3in1 (PT) (20x20 w/c with elevating leg rests)    Recommendations for Other Services       Precautions / Restrictions Precautions Precautions: Fall Restrictions Weight Bearing Restrictions: Yes RLE Weight Bearing: Non weight bearing     Mobility  Bed Mobility Overal bed mobility: Modified Independent Bed Mobility: Supine to Sit     Supine to sit: Modified independent (Device/Increase time);HOB elevated (continues to use BUE to assist RLE to EOB)          Transfers Overall transfer level:  Needs assistance Equipment used: Rolling walker (2 wheels) Transfers: Sit to/from [employer] Sit to Stand: Min assist (cuing for shifting weight anteriorly) Stand pivot transfers: Min assist (Pt is able clear foot to "hop" at times, otherwise scoots LLE across floor to turn to recliner)         General transfer comment: Pt requires max cuing for hand placement during stand>sit & cuing for eccentric lowering with poor return demo.    Ambulation/Gait Ambulation/Gait assistance: Min assist Gait Distance (Feet):  (3 ft + 5 ft) Assistive device: Rolling walker (2 wheels) Gait Pattern/deviations: Decreased step length - left;Step-to pattern Gait velocity: decreased   General Gait Details: Pt is able to take steps forward with RW & min assist but extremely short step length LLE, minimal foot clearance, but good understanding of using UE to alleviate weight on LLE.   Stairs             Wheelchair Mobility    Modified Rankin (Stroke Patients Only)       Balance Overall balance assessment: Needs assistance Sitting-balance support: Feet supported Sitting balance-Leahy Scale: Good     Standing balance support: Bilateral upper extremity supported;During functional activity Standing balance-Leahy Scale: Poor                              Cognition Arousal/Alertness: Awake/alert Behavior During Therapy: WFL for tasks assessed/performed;Anxious Overall Cognitive Status: Within Functional Limits for tasks assessed  Exercises      General Comments        Pertinent Vitals/Pain Pain Assessment: Faces Faces Pain Scale: Hurts little more Pain Location: RLE in dependent position Pain Descriptors / Indicators: Grimacing;Discomfort Pain Intervention(s): Monitored during session;Repositioned    Home Living                          Prior Function            PT Goals (current  goals can now be found in the care plan section) Acute Rehab PT Goals Patient Stated Goal: decreased pain PT Goal Formulation: With patient Time For Goal Achievement: 06/08/21 Potential to Achieve Goals: Good Progress towards PT goals: Progressing toward goals    Frequency           PT Plan Current plan remains appropriate    Co-evaluation              AM-PAC PT "6 Clicks" Mobility   Outcome Measure  Help needed turning from your back to your side while in a flat bed without using bedrails?: None Help needed moving from lying on your back to sitting on the side of a flat bed without using bedrails?: A Little Help needed moving to and from a bed to a chair (including a wheelchair)?: A Little Help needed standing up from a chair using your arms (e.g., wheelchair or bedside chair)?: A Little Help needed to walk in hospital room?: A Little Help needed climbing 3-5 steps with a railing? : A Lot 6 Click Score: 18    End of Session   Activity Tolerance: Patient tolerated treatment well;Patient limited by pain Patient left: in chair;with call bell/phone within reach   PT Visit Diagnosis: Unsteadiness on feet (R26.81);Muscle weakness (generalized) (M62.81);Difficulty in walking, not elsewhere classified (R26.2);Pain Pain - Right/Left: Right Pain - part of body: Leg;Ankle and joints of foot     Time: 0920-0932 PT Time Calculation (min) (ACUTE ONLY): 12 min  Charges:  $Gait Training: 8-22 mins                     Aleda Grana, PT, DPT 05/27/21, 9:50 AM    Sandi Mariscal 05/27/2021, 9:49 AM

## 2021-05-27 NOTE — ED Notes (Signed)
Pt oob to BR with walker and 1 assist

## 2021-05-28 NOTE — Progress Notes (Signed)
PT Cancellation Note  Patient Details Name: Melissa Hunter MRN: [id number] DOB: 13-Sep-1955   Cancelled Treatment:     Pt not able to participate at time of visit. Will continue skilled PT services next availability.   Jannet Askew 05/28/2021, 4:15 PM

## 2021-05-28 NOTE — ED Notes (Signed)
Patient given breakfast tray.

## 2021-05-28 NOTE — TOC Progression Note (Signed)
Transition of Care [facility]) - Progression Note    Patient Details  Name: Melissa Hunter MRN: [id number] Date of Birth: Apr 15, 1956  Transition of Care [facility]) CM/SW Contact  Joseph Art, [place] Phone Number: 05/28/2021, 8:41 AM  Clinical Narrative:     CSW contacted Tina from [employer], who stated she is looking into the open MSP and she will update this CSW once she can verify it has been closed.  An open MSP delays all SNF placement.    Expected Discharge Plan: Skilled Nursing Facility Barriers to Discharge: Continued Medical Work up  Expected Discharge Plan and Services Expected Discharge Plan: Skilled Nursing Facility In-house Referral: Clinical Social Work     Living arrangements for the past 2 months: Single Family Home                                       Social Determinants of Health (SDOH) Interventions    Readmission Risk Interventions No flowsheet data found.
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
## 2021-05-28 NOTE — ED Notes (Signed)
Patient given black coffee.

## 2021-05-28 NOTE — ED Notes (Signed)
Pt given water, coffee, pain medication per request. Purewick emptied 700c output. Provided with warm wipes for body as well as warm wash cloths for facial care. Water & spit cup for patient to brush teeth.    Denies any other needs at this time. Call light in reach.

## 2021-05-29 NOTE — ED Notes (Signed)
Pt is resting with equal rise and fall of the chest.

## 2021-05-29 NOTE — TOC Progression Note (Signed)
Transition of Care [facility]) - Progression Note    Patient Details  Name: Melissa Hunter MRN: [id number] Date of Birth: [birth year]  Transition of Care [facility]) CM/SW Contact  Joseph Art, [place] Phone Number: 05/29/2021, 8:21 AM  Clinical Narrative:     CSW contacted Inetta Fermo from [employer], requesting update  on patient [facility] confirmation is has been closed.  An open MSP delays all SNF placement.  Expected Discharge Plan: Skilled Nursing Facility Barriers to Discharge: Continued Medical Work up  Expected Discharge Plan and Services Expected Discharge Plan: Skilled Nursing Facility In-house Referral: Clinical Social Work     Living arrangements for the past 2 months: Single Family Home                                       Social Determinants of Health (SDOH) Interventions    Readmission Risk Interventions No flowsheet data found.

## 2021-05-29 NOTE — ED Provider Notes (Signed)
-----------------------------------------   7:47 AM on 05/29/2021 -----------------------------------------   Blood pressure 130/61, pulse 67, temperature 98.5 F (36.9 C), temperature source Oral, resp. rate 18, height 1.727 m (5\' 8" ), weight 112.5 kg, SpO2 98 %.  The patient is calm and cooperative at this time.  There have been no acute events since the last update.  Awaiting disposition plan from [facility] team.   [facility], MD 05/29/21 [phone]

## 2021-05-29 NOTE — TOC Progression Note (Addendum)
Transition of Care [facility]) - Progression Note    Patient Details  Name: Melissa Hunter MRN: [id number] Date of Birth: 01-02-1956  Transition of Care [facility]) CM/SW Contact  Marina Goodell Phone Number: [phone] 05/29/2021, 12:25 PM  Clinical Narrative:     CSW received updated from [facility].  Patient's MSP is closed.  CSW started insurance authorization for SNF placement.  Expected Discharge Plan: Skilled Nursing Facility Barriers to Discharge: Continued Medical Work up  Expected Discharge Plan and Services Expected Discharge Plan: Skilled Nursing Facility In-house Referral: Clinical Social Work     Living arrangements for the past 2 months: Single Family Home                                       Social Determinants of Health (SDOH) Interventions    Readmission Risk Interventions No flowsheet data found.

## 2021-05-29 NOTE — Progress Notes (Signed)
Physical Therapy Treatment Patient Details Name: Melissa Hunter MRN: [id number] DOB: 07/23/1956 Today's Date: 05/29/2021   History of Present Illness Pt is a 65 y/o F who presented to the ED on 05/24/21 with c/o worsening R ankle pain. Pt was evaluated in the ED on 10/19 after falling in her bathroom & X-ray of the right ankle revealed trimalleolar fracture with mortise displacement. Splint was placed & pt was instructed to f/u as outpatient as swelling decreased. PMH: GERD, HA, HTN, MS, sleep apnea    PT Comments    Pt received in hallway. Modified session due to privacy concerns. Reviewed and participated in B LE strengthening exercises. Pt educated on weight bearing restrictions and safe mobility with RW. Discussed POC and recommendations for SNF placement once bed available. Pt's family/friend to bring clothes/shoes once facility is chosen. Pt is anxious to begin rehab and return home. Continue PT as able until d/c.   Recommendations for follow up therapy are one component of a multi-disciplinary discharge planning process, led by the attending physician.  Recommendations may be updated based on patient status, additional functional criteria and insurance authorization.  Follow Up Recommendations  Skilled nursing-short term rehab (<3 hours/day)     Assistance Recommended at Discharge Frequent or constant Supervision/Assistance  Equipment Recommendations  Wheelchair cushion (measurements PT);Rollator (4 wheels);3in1 (PT)    Recommendations for Other Services       Precautions / Restrictions Precautions Precautions: Fall Restrictions Weight Bearing Restrictions: Yes RLE Weight Bearing: Non weight bearing     Mobility  Bed Mobility                    Transfers                        Ambulation/Gait                 Stairs             Wheelchair Mobility    Modified Rankin (Stroke Patients Only)       Balance                                             Cognition Arousal/Alertness: Awake/alert Behavior During Therapy: WFL for tasks assessed/performed Overall Cognitive Status: Within Functional Limits for tasks assessed                                 General Comments: Pt is very limited with PT intervention due to her bed being parked in the hallway for 2 days now.        Exercises      General Comments General comments (skin integrity, edema, etc.): R LE remains with ace wrapped partial cast      Pertinent Vitals/Pain Pain Assessment: 0-10 Pain Score: 8  Pain Location: RLE in dependent position Pain Descriptors / Indicators: Grimacing;Discomfort Pain Intervention(s): Limited activity within patient's tolerance    Home Living                          Prior Function            PT Goals (current goals can now be found in the care plan section) Acute Rehab PT Goals Patient Stated Goal: Get to rehab  Frequency    7X/week      PT Plan Current plan remains appropriate    Co-evaluation              AM-PAC PT "6 Clicks" Mobility   Outcome Measure  Help needed turning from your back to your side while in a flat bed without using bedrails?: None Help needed moving from lying on your back to sitting on the side of a flat bed without using bedrails?: A Little Help needed moving to and from a bed to a chair (including a wheelchair)?: A Little Help needed standing up from a chair using your arms (e.g., wheelchair or bedside chair)?: A Little Help needed to walk in hospital room?: A Little Help needed climbing 3-5 steps with a railing? : A Lot 6 Click Score: 18    End of Session         PT Visit Diagnosis: Unsteadiness on feet (R26.81);Muscle weakness (generalized) (M62.81);Difficulty in walking, not elsewhere classified (R26.2);Pain Pain - Right/Left: Right Pain - part of body: Leg;Ankle and joints of foot (when hanging off bed)     Time:  4503-8882 PT Time Calculation (min) (ACUTE ONLY): 24 min  Charges:  $Therapeutic Exercise: 8-22 mins $Therapeutic Activity: 8-22 mins                    Zadie Cleverly, PTA    Jannet Askew 05/29/2021, 12:48 PM

## 2021-05-30 LAB — RESP PANEL BY RT-PCR (FLU A&B, COVID) ARPGX2
Influenza A by PCR: NEGATIVE
Influenza B by PCR: NEGATIVE
SARS Coronavirus 2 by RT PCR: NEGATIVE

## 2021-05-30 NOTE — ED Notes (Signed)
[facility]  CALLED  FOR  TRANSPORT  TO  [facility] 

## 2021-05-30 NOTE — TOC Progression Note (Signed)
Transition of Care [facility]) - Progression Note    Patient Details  Name: Melissa Hunter MRN: [id number] Date of Birth: 1956-07-17  Transition [facility] CM/SW Contact  Hetty Ely, RN Phone Number: 05/30/2021, 12:02 PM  Clinical Narrative:  Inetta Fermo from [facility] bed offer today, however patient will need Rapid Covid test if negative call report to [phone] ask for nurse on [address], patient room number 712. Spoke with patient she is receptive to discharge plan.      Expected Discharge Plan: Skilled Nursing Facility Barriers to Discharge: Continued Medical Work up  Expected Discharge Plan and Services Expected Discharge Plan: Skilled Nursing Facility In-house Referral: Clinical Social Work     Living arrangements for the past 2 months: Single Family Home                                       Social Determinants of Health (SDOH) Interventions    Readmission Risk Interventions No flowsheet data found.

## 2021-05-30 NOTE — Progress Notes (Signed)
   05/30/21 0920  Clinical Encounter Type  Visited With Patient  Visit Type Initial;Spiritual support  Referral From Chaplain  Consult/Referral To Chaplain   Chaplain contacted local [facility] [place] as requested, for him to administer blessing of the sick sacrament.

## 2021-05-30 NOTE — ED Notes (Signed)
Report called to charlie rn at [facility] all questions answered

## 2021-05-30 NOTE — TOC Transition Note (Addendum)
Transition of Care [facility]) - CM/SW Discharge Note   Patient Details  Name: ELOYCE BULTMAN MRN: [id number] Date of Birth: 06-May-1956  Transition of Care [facility]) CM/SW Contact:  Hetty Ely, RN Phone Number: 05/30/2021, 12:39 PM   Clinical Narrative:  Patient to discharge to [facility], room 712 via EMS to be arranged by ED Secretary. Plan auth: [id number] given to [facility] [place]. Patient receptive to discharge plan. Nurse given number to call report after the negative rapid COVID test. TOC barriers resolved. 1:41pm. Tina from [facility] informed of the scheduled follow up appointment with Dr. Okey Dupre at 10am at the [facility] in [place].    Final next level of care: Skilled Nursing Facility Barriers to Discharge: Barriers Resolved   Patient Goals and CMS Choice Patient states their goals for this hospitalization and ongoing recovery are:: "To get my ankle stronger"      Discharge Placement              Patient chooses bed at: [facility] [place] Patient to be transferred to facility by: EMS to be arranged by ED staff.   Patient and family notified of of transfer: 05/30/21  Discharge Plan and Services In-house Referral: Clinical Social Work                                   Social Determinants of Health (SDOH) Interventions     Readmission Risk Interventions No flowsheet data found.

## 2021-05-30 NOTE — ED Notes (Signed)
Pt cleaned and new linen, brief, and purewick applied. Pt denies any further needs.

## 2021-05-30 NOTE — ED Provider Notes (Signed)
Discussed case with Dr. Odis Luster (Ortho). Advises will need in office follow-up next week. Had to be delayed due to the facture bilsters originally. Recommends follow-up next Wed or Thursday. Dr. Odis Luster will have his office arrange follow-up.    Sharyn Creamer, MD 05/30/21 1302

## 2021-06-06 ENCOUNTER — Encounter: Payer: Self-pay | Admitting: Emergency Medicine

## 2021-06-06 ENCOUNTER — Other Ambulatory Visit: Payer: Self-pay

## 2021-06-06 ENCOUNTER — Emergency Department
Admission: EM | Admit: 2021-06-06 | Discharge: 2021-06-06 | Disposition: A | Discharge status: Home / Self Care | Payer: Medicare Other | Attending: Emergency Medicine | Admitting: Emergency Medicine

## 2021-06-06 DIAGNOSIS — F17210 Nicotine dependence, cigarettes, uncomplicated: Secondary | ICD-10-CM | POA: Diagnosis not present

## 2021-06-06 DIAGNOSIS — M25571 Pain in right ankle and joints of right foot: Secondary | ICD-10-CM

## 2021-06-06 DIAGNOSIS — I10 Essential (primary) hypertension: Secondary | ICD-10-CM | POA: Insufficient documentation

## 2021-06-06 DIAGNOSIS — Z79899 Other long term (current) drug therapy: Secondary | ICD-10-CM | POA: Diagnosis not present

## 2021-06-06 DIAGNOSIS — E039 Hypothyroidism, unspecified: Secondary | ICD-10-CM | POA: Insufficient documentation

## 2021-06-06 DIAGNOSIS — X58XXXA Exposure to other specified factors, initial encounter: Secondary | ICD-10-CM | POA: Insufficient documentation

## 2021-06-06 DIAGNOSIS — S90521A Blister (nonthermal), right ankle, initial encounter: Secondary | ICD-10-CM | POA: Diagnosis not present

## 2021-06-06 DIAGNOSIS — S99911A Unspecified injury of right ankle, initial encounter: Secondary | ICD-10-CM | POA: Diagnosis present

## 2021-06-06 LAB — CBC WITH DIFFERENTIAL/PLATELET
Abs Immature Granulocytes: 0.02 10*3/uL (ref 0.00–0.07)
Basophils Absolute: 0 10*3/uL (ref 0.0–0.1)
Basophils Relative: 1 %
Eosinophils Absolute: 0.1 10*3/uL (ref 0.0–0.5)
Eosinophils Relative: 2 %
HCT: 43.9 % (ref 36.0–46.0)
Hemoglobin: 14.8 g/dL (ref 12.0–15.0)
Immature Granulocytes: 0 %
Lymphocytes Relative: 8 %
Lymphs Abs: 0.5 10*3/uL — ABNORMAL LOW (ref 0.7–4.0)
MCH: 30.1 pg (ref 26.0–34.0)
MCHC: 33.7 g/dL (ref 30.0–36.0)
MCV: 89.4 fL (ref 80.0–100.0)
Monocytes Absolute: 0.5 10*3/uL (ref 0.1–1.0)
Monocytes Relative: 10 %
Neutro Abs: 4.3 10*3/uL (ref 1.7–7.7)
Neutrophils Relative %: 79 %
Platelets: 286 10*3/uL (ref 150–400)
RBC: 4.91 MIL/uL (ref 3.87–5.11)
RDW: 13.1 % (ref 11.5–15.5)
WBC: 5.5 10*3/uL (ref 4.0–10.5)
nRBC: 0 % (ref 0.0–0.2)

## 2021-06-06 LAB — COMPREHENSIVE METABOLIC PANEL
ALT: 13 U/L (ref 0–44)
AST: 19 U/L (ref 15–41)
Albumin: 4.3 g/dL (ref 3.5–5.0)
Alkaline Phosphatase: 114 U/L (ref 38–126)
Anion gap: 9 (ref 5–15)
BUN: 23 mg/dL (ref 8–23)
CO2: 25 mmol/L (ref 22–32)
Calcium: 10 mg/dL (ref 8.9–10.3)
Chloride: 102 mmol/L (ref 98–111)
Creatinine, Ser: 1.06 mg/dL — ABNORMAL HIGH (ref 0.44–1.00)
GFR, Estimated: 58 mL/min — ABNORMAL LOW (ref >60)
Glucose, Bld: 187 mg/dL — ABNORMAL HIGH (ref 70–99)
Potassium: 4.2 mmol/L (ref 3.5–5.1)
Sodium: 136 mmol/L (ref 135–145)
Total Bilirubin: 1.4 mg/dL — ABNORMAL HIGH (ref 0.3–1.2)
Total Protein: 7.6 g/dL (ref 6.5–8.1)

## 2021-06-06 LAB — LACTIC ACID, PLASMA: Lactic Acid, Venous: 1.7 mmol/L (ref 0.5–1.9)

## 2021-06-06 NOTE — Discharge Instructions (Signed)
Please seek medical attention for any high fevers, chest pain, shortness of breath, change in behavior, persistent vomiting, bloody stool or any other new or concerning symptoms.  

## 2021-06-06 NOTE — ED Provider Notes (Signed)
[facility] Emergency Department Provider Note   ____________________________________________   I have reviewed the triage vital signs and the nursing notes.   HISTORY  Chief Complaint Right ankle pain  History limited by: Not Limited   HPI Melissa Hunter is a 65 y.o. female who presents to the emergency department today because of concerns for right ankle pain and discomfort.  Patient did fracture her ankle a couple of weeks ago.  She states however because of some blisters that have not been able to perform any surgery.  She states that she did recently go back to [facility] and they put on a new splint however she is complaining that it has created a lot of pain.  She was trying to take the splint off today at [facility] however they told her that they would not let her take it off and that she had to go to the emergency department to have it removed.  Records reviewed. Per medical record review patient has a history of HTN, recent ankle fracture.  Past Medical History:  Diagnosis Date   Family history of adverse reaction to anesthesia    PTS BROTHER-AGITATED, COMBATIVE   GERD (gastroesophageal reflux disease)    H/O   Headache     MIGRAINES-RARE   Hypertension    Hypothyroidism    Neuromuscular disorder (HCC)    MS   Sleep apnea    has cpap but has not used in a long time.    There are no problems to display for this patient.   Past Surgical History:  Procedure Laterality Date   BOTOX INJECTION N/A 04/08/2016   Procedure: BOTOX INJECTION;  Surgeon: Orson Ape, MD;  Location: [facility] ORS;  Service: Urology;  Laterality: N/A;   BOTOX INJECTION N/A 11/04/2016   Procedure: BOTOX INJECTION;  Surgeon: Orson Ape, MD;  Location: [facility] ORS;  Service: Urology;  Laterality: N/A;   CYSTOSCOPY N/A 04/08/2016   Procedure: CYSTOSCOPY;  Surgeon: Orson Ape, MD;  Location: [facility] ORS;  Service: Urology;  Laterality: N/A;   CYSTOSCOPY N/A 11/04/2016    Procedure: CYSTOSCOPY;  Surgeon: Orson Ape, MD;  Location: [facility] ORS;  Service: Urology;  Laterality: N/A;   STAPEDECTOMY      Prior to Admission medications   Medication Sig Start Date End Date Taking? Authorizing Provider  atorvastatin (LIPITOR) 20 MG tablet Take 20 mg by mouth at bedtime.    [provider]  baclofen (LIORESAL) 10 MG tablet Take 10 mg by mouth 2 (two) times daily.    [provider]  Cholecalciferol (VITAMIN D) 2000 units tablet Take 2,000 Units by mouth daily.    [provider]  levothyroxine (SYNTHROID, LEVOTHROID) 125 MCG tablet Take 125 mcg by mouth daily before breakfast.    [provider]  Multiple Vitamins-Minerals (MULTIVITAMIN WITH MINERALS) tablet Take 1 tablet by mouth daily.    [provider]  oxyCODONE-acetaminophen (PERCOCET) 5-325 MG tablet Take 1 tablet by mouth every 4 (four) hours as needed for severe pain. 05/22/21 05/22/22  Fisher, Roselyn Bering, PA-C  ramipril (ALTACE) 10 MG capsule Take 10 mg by mouth 2 (two) times daily.    [provider]  sertraline (ZOLOFT) 100 MG tablet Take 100 mg by mouth at bedtime.    [provider]    Allergies Patient has no known allergies.  History reviewed. No pertinent family history.  Social History Social History   Tobacco Use   Smoking status: Every Day  Packs/day: 0.50    Years: 25.00    Pack years: 12.50    Types: Cigarettes   Smokeless tobacco: Never  Substance Use Topics   Alcohol use: Yes    Comment: 3 GLASSESS WINE/WEEK   Drug use: No    Review of Systems Constitutional: No fever/chills Eyes: No visual changes. ENT: No sore throat. Cardiovascular: Denies chest pain. Respiratory: Denies shortness of breath. Gastrointestinal: No abdominal pain.  No nausea, no vomiting.  No diarrhea.   Genitourinary: Negative for dysuria. Musculoskeletal: Positive for right ankle pain. Skin: Positive for blisters around right ankle.   Neurological: Negative for headaches, focal weakness or numbness.  ____________________________________________   PHYSICAL EXAM:  VITAL SIGNS: ED Triage Vitals [06/06/21 1437]  Enc Vitals Group     BP      Pulse      Resp      Temp      Temp src      SpO2      Weight 248 lb 0.3 oz (112.5 kg)     Height 5\' 8"  (1.727 m)     Head Circumference      [facility] Flow      Pain Score 10    Constitutional: Alert and oriented.  Eyes: Conjunctivae are normal.  ENT      Head: Normocephalic and atraumatic.      Nose: No congestion/rhinnorhea.      Mouth/Throat: Mucous membranes are moist.      Neck: No stridor. Hematological/Lymphatic/Immunilogical: No cervical lymphadenopathy. Cardiovascular: Normal rate, regular rhythm.  No murmurs, rubs, or gallops.  Respiratory: Normal respiratory effort without tachypnea nor retractions. Breath sounds are clear and equal bilaterally. No wheezes/rales/rhonchi. Gastrointestinal: Soft and non tender. No rebound. No guarding.  Genitourinary: Deferred Musculoskeletal: Right ankle in splint. Once splint removed some swelling and deformity noted to ankle. Neurologic:  Normal speech and language. No gross focal neurologic deficits are appreciated.  Skin:  Multiple blisters around right ankle, some opened. No surrounding erythema or signs of infection. Psychiatric: Mood and affect are normal. Speech and behavior are normal. Patient exhibits appropriate insight and judgment.  ____________________________________________    LABS (pertinent positives/negatives)  Lactic acid 1.7 CBC wbc 5.5, hgb 14.8, plt 286 CMP na 136, k 4.2, glu 187, cr 1.06  ____________________________________________   EKG  None  ____________________________________________    RADIOLOGY  None  ____________________________________________   PROCEDURES  Procedures  ____________________________________________   INITIAL IMPRESSION / ASSESSMENT AND PLAN / ED  COURSE  Pertinent labs & imaging results that were available during my care of the patient were reviewed by me and considered in my medical decision making (see chart for details).   Patient presents to the emergency department with complaints of right ankle pain.  Patient had a splint that was placed by [facility].  This was removed so I can visualize the skin.  Patient does have some blisters to the area and some that have opened up however no surrounding erythema or evidence of infection.  Patient's ankle was placed back in a new splint.  Will discharge back to [facility].   ____________________________________________   FINAL CLINICAL IMPRESSION(S) / ED DIAGNOSES  Final diagnoses:  Acute right ankle pain     Note: This dictation was prepared with Dragon dictation. Any transcriptional errors that result from this process are unintentional     , MD 06/06/21 1815

## 2021-06-06 NOTE — ED Triage Notes (Signed)
Pt comes into the ED via [facility] from [facility] c/o right leg pain.  Pt is there for rehab after a fall 2 weeks ago and having a tibial fracture.  Pt presents with a 3" lateral hematoma on the ankle.  Staff sent her out to r/o any infection.  5mg  Oxycodone given at 12:30.    167/96 97% RA 87HR 20 RR

## 2021-06-06 NOTE — ED Notes (Signed)
Per pt request, pt placed outside ER entrance in [facility] to wait for her ride.

## 2021-06-14 ENCOUNTER — Other Ambulatory Visit: Payer: Self-pay

## 2021-06-14 DIAGNOSIS — Z0279 Encounter for issue of other medical certificate: Secondary | ICD-10-CM | POA: Insufficient documentation

## 2021-06-14 DIAGNOSIS — X58XXXD Exposure to other specified factors, subsequent encounter: Secondary | ICD-10-CM | POA: Diagnosis not present

## 2021-06-14 DIAGNOSIS — I10 Essential (primary) hypertension: Secondary | ICD-10-CM | POA: Insufficient documentation

## 2021-06-14 DIAGNOSIS — M6281 Muscle weakness (generalized): Secondary | ICD-10-CM | POA: Diagnosis not present

## 2021-06-14 DIAGNOSIS — E039 Hypothyroidism, unspecified: Secondary | ICD-10-CM | POA: Diagnosis not present

## 2021-06-14 DIAGNOSIS — Z79899 Other long term (current) drug therapy: Secondary | ICD-10-CM | POA: Diagnosis not present

## 2021-06-14 DIAGNOSIS — R2681 Unsteadiness on feet: Secondary | ICD-10-CM | POA: Diagnosis not present

## 2021-06-14 DIAGNOSIS — U071 COVID-19: Secondary | ICD-10-CM | POA: Insufficient documentation

## 2021-06-14 DIAGNOSIS — S82851D Displaced trimalleolar fracture of right lower leg, subsequent encounter for closed fracture with routine healing: Secondary | ICD-10-CM | POA: Insufficient documentation

## 2021-06-14 DIAGNOSIS — F17210 Nicotine dependence, cigarettes, uncomplicated: Secondary | ICD-10-CM | POA: Diagnosis not present

## 2021-06-14 NOTE — ED Triage Notes (Signed)
Pt to ED for placement to [facility]. [facility] of [facility] because [facility] over night. [facility] leg in multiple places. Alert and oriented.

## 2021-06-14 NOTE — ED Provider Notes (Signed)
Emergency Medicine Provider Triage Evaluation Note  Melissa Hunter, a 65 y.o. female  was evaluated in triage.  Pt complains of needing placement. She apparently was at [facility] for a planned Right ankle fracture pending repair. She left to return home, and was gone greater than 24 hours, as such she subsequently lost her room. She denies any interim injury, trauma, or fall. She now needs clearance for SNF placement.  Review of Systems  Positive: RLE fracture Negative: Pain, swelling  Physical Exam  BP 116/76   Pulse 87   Temp 97.8 F (36.6 C) (Oral)   Resp 20   Ht 5\' 7"  (1.702 m)   Wt 113 kg   SpO2 98%   BMI 39.02 kg/m  Gen:   Awake, no distress  NAD Resp:  Normal effort CTA MSK:   Moves extremities without difficulty RLE with splint in place Other:  CVA: normal cap refilll  Medical Decision Making  Medically screening exam initiated at 6:35 PM.  Appropriate orders placed.  DESHON KOSLOWSKI was informed that the remainder of the evaluation will be completed by another provider, this initial triage assessment does not replace that evaluation, and the importance of remaining in the ED until their evaluation is complete.  Patient in need of medical clearance for re-placement at SNF/[facility].    Frances Furbish, PA-C 06/14/21 13/11/22, MD 06/17/21 [id number]

## 2021-06-15 ENCOUNTER — Emergency Department
Admission: EM | Admit: 2021-06-15 | Discharge: 2021-06-17 | Disposition: A | Discharge status: Home / Self Care | Payer: Medicare Other | Attending: Emergency Medicine | Admitting: Emergency Medicine

## 2021-06-15 DIAGNOSIS — Z008 Encounter for other general examination: Secondary | ICD-10-CM

## 2021-06-15 DIAGNOSIS — U071 COVID-19: Secondary | ICD-10-CM

## 2021-06-15 DIAGNOSIS — S82851D Displaced trimalleolar fracture of right lower leg, subsequent encounter for closed fracture with routine healing: Secondary | ICD-10-CM

## 2021-06-15 LAB — RESP PANEL BY RT-PCR (FLU A&B, COVID) ARPGX2
Influenza A by PCR: NEGATIVE
Influenza B by PCR: NEGATIVE
SARS Coronavirus 2 by RT PCR: POSITIVE — AB

## 2021-06-15 MED ORDER — OXYCODONE-ACETAMINOPHEN 5-325 MG PO TABS
1.0000 | ORAL_TABLET | ORAL | Status: DC | PRN
  Administered 2021-06-16: 1 via ORAL
  Filled 2021-06-15: qty 1

## 2021-06-15 MED ORDER — SERTRALINE HCL 50 MG PO TABS
100.0000 mg | ORAL_TABLET | Freq: Every day | ORAL | Status: DC
  Administered 2021-06-15 – 2021-06-16 (×2): 100 mg via ORAL
  Filled 2021-06-15 (×2): qty 2

## 2021-06-15 MED ORDER — RAMIPRIL 10 MG PO CAPS
10.0000 mg | ORAL_CAPSULE | Freq: Two times a day (BID) | ORAL | Status: DC
  Administered 2021-06-15 – 2021-06-17 (×5): 10 mg via ORAL
  Filled 2021-06-15 (×7): qty 1

## 2021-06-15 MED ORDER — ADULT MULTIVITAMIN W/MINERALS CH
1.0000 | ORAL_TABLET | Freq: Every day | ORAL | Status: DC
  Administered 2021-06-15 – 2021-06-17 (×3): 1 via ORAL
  Filled 2021-06-15 (×3): qty 1

## 2021-06-15 MED ORDER — BACLOFEN 10 MG PO TABS
10.0000 mg | ORAL_TABLET | Freq: Two times a day (BID) | ORAL | Status: DC
  Administered 2021-06-15 – 2021-06-17 (×5): 10 mg via ORAL
  Filled 2021-06-15 (×7): qty 1

## 2021-06-15 MED ORDER — ATORVASTATIN CALCIUM 20 MG PO TABS
20.0000 mg | ORAL_TABLET | Freq: Every day | ORAL | Status: DC
  Administered 2021-06-15 – 2021-06-16 (×2): 20 mg via ORAL
  Filled 2021-06-15 (×2): qty 1

## 2021-06-15 MED ORDER — LEVOTHYROXINE SODIUM 50 MCG PO TABS
125.0000 ug | ORAL_TABLET | Freq: Every day | ORAL | Status: DC
  Administered 2021-06-15 – 2021-06-17 (×3): 125 ug via ORAL
  Filled 2021-06-15: qty 2.5
  Filled 2021-06-15 (×2): qty 3

## 2021-06-15 MED ORDER — VITAMIN D 25 MCG (1000 UNIT) PO TABS
2000.0000 [IU] | ORAL_TABLET | Freq: Every day | ORAL | Status: DC
Start: 2021-06-15 — End: 2021-06-17
  Administered 2021-06-15 – 2021-06-17 (×3): 2000 [IU] via ORAL
  Filled 2021-06-15 (×3): qty 2

## 2021-06-15 NOTE — ED Notes (Signed)
Hourly rounding reveals patient in room. No complaints, stable, in no acute distress. Q15 minute rounds and monitoring via Rover and Officer to continue.   

## 2021-06-15 NOTE — NC FL2 (Addendum)
[place] MEDICAID FL2 LEVEL OF CARE SCREENING TOOL     IDENTIFICATION  Patient Name: Melissa Hunter Birthdate: Mar 07, 1956 Sex: female Admission Date (Current Location): 06/15/2021  [place] and [place] Number:  Chiropodist and Address:  [facility], [address], [place], [place] [address]      Provider Number: [id number]  Attending Physician Name and Address:  No att. providers found  Relative Name and Phone Number:  Peg Lonia Farber (friend) [phone]    Current Level of Care: Hospital Recommended Level of Care: Skilled Nursing Facility Prior Approval Number:    Date Approved/Denied:   PASRR Number: [id number]  Discharge Plan: SNF    Current Diagnoses: There are no problems to display for this patient.   Orientation RESPIRATION BLADDER Height & Weight     Self, Time, Situation, Place  Normal Continent Weight: 113 kg Height:  5\' 7"  (170.2 cm)  BEHAVIORAL SYMPTOMS/MOOD NEUROLOGICAL BOWEL NUTRITION STATUS      Continent Diet  AMBULATORY STATUS COMMUNICATION OF NEEDS Skin   Limited Assist Verbally Bruising (Right ankle)                       Personal Care Assistance Level of Assistance  Bathing, Feeding, Dressing Bathing Assistance: Limited assistance Feeding assistance: Independent Dressing Assistance: Limited assistance     Functional Limitations Info  Sight, Hearing, Speech Sight Info: Adequate Hearing Info: Adequate Speech Info: Adequate    SPECIAL CARE FACTORS FREQUENCY  PT (By licensed PT), OT (By licensed OT)     PT Frequency: 5 times per week OT Frequency: 5 times per week            Contractures Contractures Info: Not present    Additional Factors Info  Code Status, Allergies, Isolation Precautions Code Status Info: Full Allergies Info: NKA     Isolation Precautions Info: COVID     Current Medications (06/15/2021):  This is the current hospital active medication list Current  Facility-Administered Medications  Medication Dose Route Frequency Provider Last Rate Last Admin   atorvastatin (LIPITOR) tablet 20 mg  20 mg Oral QHS 13/07/2021, MD       baclofen (LIORESAL) tablet 10 mg  10 mg Oral BID Loleta Rose, MD   10 mg at 06/15/21 0900   cholecalciferol (VITAMIN D3) tablet 2,000 Units  2,000 Units Oral Daily 13/12/22, MD   2,000 Units at 06/15/21 0900   levothyroxine (SYNTHROID) tablet 125 mcg  125 mcg Oral QAC breakfast 13/12/22, MD   125 mcg at 06/15/21 13/12/22   multivitamin with minerals tablet 1 tablet  1 tablet Oral Daily 0093, MD   1 tablet at 06/15/21 0900   oxyCODONE-acetaminophen (PERCOCET/ROXICET) 5-325 MG per tablet 1 tablet  1 tablet Oral Q4H PRN 13/12/22, MD       ramipril (ALTACE) capsule 10 mg  10 mg Oral BID Loleta Rose, MD   10 mg at 06/15/21 0900   sertraline (ZOLOFT) tablet 100 mg  100 mg Oral QHS 13/12/22, MD       Current Outpatient Medications  Medication Sig Dispense Refill   baclofen (LIORESAL) 10 MG tablet Take 10 mg by mouth 2 (two) times daily.     Cholecalciferol (VITAMIN D) 2000 units tablet Take 2,000 Units by mouth daily.     levothyroxine (SYNTHROID, LEVOTHROID) 125 MCG tablet Take 125 mcg by mouth daily before breakfast.     Multiple Vitamins-Minerals (MULTIVITAMIN WITH MINERALS) tablet Take 1  tablet by mouth daily.     oxyCODONE-acetaminophen (PERCOCET) 5-325 MG tablet Take 1 tablet by mouth every 4 (four) hours as needed for severe pain. 20 tablet 0   ramipril (ALTACE) 10 MG capsule Take 10 mg by mouth 2 (two) times daily.     sertraline (ZOLOFT) 100 MG tablet Take 100 mg by mouth at bedtime.     atorvastatin (LIPITOR) 20 MG tablet Take 20 mg by mouth at bedtime.       Discharge Medications: Please see discharge summary for a list of discharge medications.  Relevant Imaging Results:  Relevant Lab Results:   Additional Information Soc Sec [id number]  Allayne Butcher, RN

## 2021-06-15 NOTE — ED Notes (Signed)
Pt's purewick and canister was changed

## 2021-06-15 NOTE — TOC Initial Note (Signed)
Transition of Care [facility]) - Initial/Assessment Note    Patient Details  Name: Melissa Hunter MRN: [id number] Date of Birth: 08/27/1955  Transition of Care [facility]) CM/SW Contact:    Allayne Butcher, RN Phone Number: 06/15/2021, 11:27 AM  Clinical Narrative:                 Patient came in to the emergency room because she needs SNF placement and she was not allowed to return to [facility] after she left and spent one night at home. RNCM was able to speak with patient over the phone and explain role in discharge planning.  Patient has tested positive for COVID. She is from home where she lives with a significant other.  Before she fractured her ankle she was independent.  Current with PCP, gets prescriptions delivered through [facility] or uses [facility].  Patient reports that she left [facility] because she had some very important things that she needed to take care of, she is in the middle of selling a house, and she says that she couldn't wait to take care of the business.  Her significant other picked her up she completed what she needed to do but then he became ill and could not take her back, so she ended up staying at her home overnight as EMS would not take her back to [facility].    Patient has tested positive for COVID and will likely need to remain  under quarantine for 10 days before admitting to a facility.  Unsure if [facility] will be willing to accept her back so will start new bed search.  Asked MD to order PT and OT.     Expected Discharge Plan: Skilled Nursing Facility Barriers to Discharge: SNF Covid   Patient Goals and CMS Choice Patient states their goals for this hospitalization and ongoing recovery are:: "get my ankle fixed" CMS Medicare.gov Compare Post Acute Care list provided to:: Patient Choice offered to / list presented to : Patient  Expected Discharge Plan and Services Expected Discharge Plan: Skilled Nursing Facility   Discharge Planning Services: CM Consult Post Acute Care  Choice: Skilled Nursing Facility Living arrangements for the past 2 months: Single Family Home                 DME Arranged: N/A DME Agency: NA       HH Arranged: NA HH Agency: NA        Prior Living Arrangements/Services Living arrangements for the past 2 months: Single Family Home Lives with:: Significant Other Patient language and need for interpreter reviewed:: Yes Do you feel safe going back to the place where you live?: Yes      Need for Family Participation in Patient Care: Yes (Comment) (ankle fracture) Care giver support system in place?: Yes (comment) (significant other and good friend) Current home services: DME (cane, walker, crutches) Criminal Activity/Legal Involvement Pertinent to Current Situation/Hospitalization: No - Comment as needed  Activities of Daily Living      Permission Sought/Granted Permission sought to share information with : Case Manager, Family Supports, Oceanographer granted to share information with : Yes, Verbal Permission Granted  Share Information with NAME: Peg Lonia Farber  Permission granted to share info w AGENCY: [facility] and other SNF's for bed search  Permission granted to share info w Relationship: friend  Permission granted to share info w Contact Information: [phone]  Emotional Assessment   Attitude/Demeanor/Rapport: Engaged Affect (typically observed): Accepting Orientation: : Oriented to Self, Oriented to  Place, Oriented to  Time, Oriented to Situation Alcohol / Substance Use: Not Applicable Psych Involvement: No (comment)  Admission diagnosis:  Weakness  EMS There are no problems to display for this patient.  PCP:  Jaclyn Shaggy, MD Pharmacy:   [facility] [phone] [place], [place] - [address] AT [facility] [address] [place] [address] Phone: [phone] Fax: [phone]     Social Determinants of Health (SDOH) Interventions     Readmission Risk Interventions No flowsheet data found.

## 2021-06-15 NOTE — ED Provider Notes (Signed)
[facility] Emergency Department Provider Note  ____________________________________________   Event Date/Time   First MD Initiated Contact with Patient 06/15/21 0124     (approximate)  I have reviewed the triage vital signs and the nursing notes.   HISTORY  Chief Complaint placement    HPI Melissa Hunter is a 65 y.o. female  with medical history as listed below who presents to the ED because she was not allowed to come back to [facility].  Reportedly she was placed at [facility] for re[facility] due to a trimalleolar fracture that occurred 3 to 4 weeks ago.  She reports that she "had to do some things at home" so she had a friend take her home yesterday.  For various reasons, she stayed overnight at her home, and when she tried to go back to [facility] she was told she could not return.  She was told that her only choice was to go to the emergency department.  She has some chronic pain in her low right foot for the last month but she has no new problems.  She specifically denies fever, sore throat, chest pain, shortness of breath, nausea, vomiting, abdominal pain, and dysuria.  She is scheduled for orthopedic follow-up this coming week.  She has no new medical complaints or concerns.     Past Medical History:  Diagnosis Date   Family history of adverse reaction to anesthesia    PTS BROTHER-AGITATED, COMBATIVE   GERD (gastroesophageal reflux disease)    H/O   Headache     MIGRAINES-RARE   Hypertension    Hypothyroidism    Neuromuscular disorder (HCC)    MS   Sleep apnea    has cpap but has not used in a long time.    There are no problems to display for this patient.   Past Surgical History:  Procedure Laterality Date   BOTOX INJECTION N/A 04/08/2016   Procedure: BOTOX INJECTION;  Surgeon: Orson Ape, MD;  Location: [facility] ORS;  Service: Urology;  Laterality: N/A;   BOTOX INJECTION N/A 11/04/2016   Procedure: BOTOX INJECTION;  Surgeon:  Orson Ape, MD;  Location: [facility] ORS;  Service: Urology;  Laterality: N/A;   CYSTOSCOPY N/A 04/08/2016   Procedure: CYSTOSCOPY;  Surgeon: Orson Ape, MD;  Location: [facility] ORS;  Service: Urology;  Laterality: N/A;   CYSTOSCOPY N/A 11/04/2016   Procedure: CYSTOSCOPY;  Surgeon: Orson Ape, MD;  Location: [facility] ORS;  Service: Urology;  Laterality: N/A;   STAPEDECTOMY      Prior to Admission medications   Medication Sig Start Date End Date Taking? Authorizing Provider  baclofen (LIORESAL) 10 MG tablet Take 10 mg by mouth 2 (two) times daily.   Yes [provider]  Cholecalciferol (VITAMIN D) 2000 units tablet Take 2,000 Units by mouth daily.   Yes [provider]  levothyroxine (SYNTHROID, LEVOTHROID) 125 MCG tablet Take 125 mcg by mouth daily before breakfast.   Yes [provider]  Multiple Vitamins-Minerals (MULTIVITAMIN WITH MINERALS) tablet Take 1 tablet by mouth daily.   Yes [provider]  oxyCODONE-acetaminophen (PERCOCET) 5-325 MG tablet Take 1 tablet by mouth every 4 (four) hours as needed for severe pain. 05/22/21 05/22/22 Yes Fisher, Roselyn Bering, PA-C  ramipril (ALTACE) 10 MG capsule Take 10 mg by mouth 2 (two) times daily.   Yes [provider]  sertraline (ZOLOFT) 100 MG tablet Take 100 mg by mouth at bedtime.   Yes [provider]  atorvastatin (LIPITOR) 20 MG tablet Take 20 mg by mouth at bedtime.    [provider]    Allergies Patient has no known allergies.  No family history on file.  Social History Social History   Tobacco Use   Smoking status: Every Day    Packs/day: 0.50    Years: 25.00    Pack years: 12.50    Types: Cigarettes   Smokeless tobacco: Never  Substance Use Topics   Alcohol use: Yes    Comment: 3 GLASSESS WINE/WEEK   Drug use: No    Review of Systems Constitutional: No fever/chills Eyes: No visual changes. ENT: No sore throat. Cardiovascular: Denies chest pain. Respiratory:  Denies shortness of breath. Gastrointestinal: No abdominal pain.  No nausea, no vomiting.  No diarrhea.  No constipation. Genitourinary: Negative for dysuria. Musculoskeletal: Chronic pain in right ankle/foot. Integumentary: Negative for rash. Neurological: Negative for headaches, focal weakness or numbness.   ____________________________________________   PHYSICAL EXAM:  VITAL SIGNS: ED Triage Vitals  Enc Vitals Group     BP 06/14/21 1826 116/76     Pulse Rate 06/14/21 1826 87     Resp 06/14/21 1826 20     Temp 06/14/21 1826 97.8 F (36.6 C)     Temp Source 06/14/21 1826 Oral     SpO2 06/14/21 1826 98 %     Weight 06/14/21 1828 113 kg (249 lb 1.9 oz)     Height 06/14/21 1828 1.702 m (5\' 7" )     Head Circumference --      [facility] Flow --      Pain Score 06/14/21 1827 3     Pain Loc --      Pain Edu? --      Excl. in GC? --     Constitutional: Alert and oriented.  Eyes: Conjunctivae are normal.  Head: Atraumatic. Nose: No congestion/rhinnorhea. Mouth/Throat: Patient is wearing a mask. Neck: No stridor.  No meningeal signs.   Cardiovascular: Normal rate, regular rhythm. Good peripheral circulation. Respiratory: Normal respiratory effort.  No retractions. Gastrointestinal: Soft and nontender. No distention.  Musculoskeletal: Splint is in place on the right ankle.  No visible abnormalities proximal to the splint nor of the toes. Neurologic:  Normal speech and language. No gross focal neurologic deficits are appreciated.  Skin:  Skin is warm, dry and intact. Psychiatric: Mood and affect are normal. Speech and behavior are normal.  ____________________________________________   LABS (all labs ordered are listed, but only abnormal results are displayed)  Labs Reviewed  RESP PANEL BY RT-PCR (FLU A&B, COVID) ARPGX2 - Abnormal; Notable for the following components:      Result Value   SARS Coronavirus 2 by RT PCR POSITIVE (*)    All other components within normal limits      INITIAL IMPRESSION / MDM / ASSESSMENT AND PLAN / ED COURSE  As part of my medical decision making, I reviewed the following data within the electronic MEDICAL RECORD NUMBER Nursing notes reviewed and incorporated, Old chart reviewed, and Notes from prior ED visits   The patient reportedly went home from [facility] and it sounds like she most likely violated a rule or policy, and now they will not let her back.  She cannot care for herself at home.  The patient has normal vital signs and no acute symptoms.  There is no benefit or indication to do lab work.  I am checking a respiratory viral panel only because it is frequently needed for placement in  a facility.  However she is asymptomatic.  I will not order blood work simply because she was not allowed to return to [facility] because there is no medical indication to do so.  Patient is medically cleared for placement.  It is the middle of the night so it is very unlikely anything will happen tonight, but I have put in a TOC consult.  Patient understands that she may need to be moved to a hallway bed in order to accommodate other patients.      Clinical Course as of 06/15/21 0424  Sat Jun 15, 2021  0423 Of note, the patient's COVID-19 test has come back positive.  However she is completely asymptomatic.  There is no indication she requires admission or treatment.  She remains medically cleared for placement.  TOC consult is pending. [CF]    Clinical Course User Index [CF] Loleta Rose, MD     ____________________________________________  FINAL CLINICAL IMPRESSION(S) / ED DIAGNOSES  Final diagnoses:  Encounter for medical clearance for patient hold  Closed right trimalleolar fracture, with routine healing, subsequent encounter  COVID-19     MEDICATIONS GIVEN DURING THIS VISIT:  Medications  atorvastatin (LIPITOR) tablet 20 mg (has no administration in time range)  baclofen (LIORESAL) tablet 10 mg (has no administration  in time range)  Vitamin D 2,000 Units (has no administration in time range)  levothyroxine (SYNTHROID) tablet 125 mcg (has no administration in time range)  multivitamin with minerals tablet 1 tablet (has no administration in time range)  oxyCODONE-acetaminophen (PERCOCET/ROXICET) 5-325 MG per tablet 1 tablet (has no administration in time range)  ramipril (ALTACE) capsule 10 mg (has no administration in time range)  sertraline (ZOLOFT) tablet 100 mg (has no administration in time range)     ED Discharge Orders     None        Note:  This document was prepared using Dragon voice recognition software and may include unintentional dictation errors.   Loleta Rose, MD 06/15/21 [phone]

## 2021-06-15 NOTE — ED Notes (Signed)
Verbal of covid pos given to York Cerise MD

## 2021-06-16 NOTE — ED Provider Notes (Signed)
-----------------------------------------   6:21 AM on 06/16/2021 -----------------------------------------   Blood pressure 111/69, pulse (!) 56, temperature 98.2 F (36.8 C), temperature source Oral, resp. rate 18, height 1.702 m (5\' 7" ), weight 113 kg, SpO2 97 %.  The patient is calm and cooperative at this time.  There have been no acute events since the last update.  Awaiting disposition plan from [facility] team.   [facility], MD 06/16/21 [phone]

## 2021-06-16 NOTE — ED Notes (Signed)
Visitor at bedside.

## 2021-06-16 NOTE — ED Provider Notes (Signed)
-----------------------------------------   2:48 PM on 06/16/2021 ----------------------------------------- Patient evaluated by physical therapy, who has deemed her appropriate for management at home with home health and physical therapy.  Unfortunately, due to patient's recent positive test for COVID-19, home health agencies are unlikely to provide service prior to 10 days of quarantine.  Social work will contact home health agencies to determine if they would be willing to provide care, but she may need to continue to board here in the ED until quarantine period is up.  Orders placed for home health including skilled nursing, PT, OT, and home health aide.   Chesley Noon, MD 06/16/21 [phone]

## 2021-06-16 NOTE — ED Provider Notes (Signed)
Patient suffers from trimalleolar fracture right leg and general weakness which impairs their ability to perform daily activities in the home. A cane will not resolve issue with performing activities of daily living. A wheelchair will allow patient to safely perform daily activities. Patient is not able to propel themselves in the home using a standard weight wheelchair due to general weakness.  Patient can self-propel in the lightweight wheelchair.     Merwyn Katos, MD 06/16/21 [phone]

## 2021-06-16 NOTE — Progress Notes (Cosign Needed Addendum)
   Patient suffers from trimalleolar fracture right leg and general weakness which impairs their ability to perform daily activities in the home. A cane will not resolve issue with performing activities of daily living. A wheelchair will allow patient to safely perform daily activities. Patient is not able to propel themselves in the home using a standard weight wheelchair due to general weakness.  Patient can self-propel in the lightweight wheelchair.

## 2021-06-16 NOTE — TOC Transition Note (Signed)
Transition of Care [facility]) - Progression Note    Patient Details  Name: [name] MRN: [id number] Date of Birth: 07/12/1956  Transition of Care [facility]) CM/SW Contact  Joseph Art, [place] Phone Number: 06/16/2021, 3:04 PM  Clinical Narrative:     Patient will d/c home with home health and DME.  [facility] has accepted the patient for PT/RN home health services.  [employer] will deliver rollator, bedside commode and wheelchair directly to the patient's residence.  Patient will d/c tomorrow 06/17/2021, via EMS, once this CSW receives conformation her equipment has been delivered.  CSW updated patient and she is in agreement with discharge plan. EDP/ED staff updated.  Expected Discharge Plan: Skilled Nursing Facility Barriers to Discharge: SNF Covid  Expected Discharge Plan and Services Expected Discharge Plan: Skilled Nursing Facility   Discharge Planning Services: CM Consult Post Acute Care Choice: Skilled Nursing Facility Living arrangements for the past 2 months: Single Family Home                 DME Arranged: N/A DME Agency: NA       HH Arranged: NA HH Agency: NA         Social Determinants of Health (SDOH) Interventions    Readmission Risk Interventions No flowsheet data found.

## 2021-06-16 NOTE — TOC Progression Note (Addendum)
Transition of Care [facility]) - Progression Note    Patient Details  Name: Melissa Hunter MRN: [id number] Date of Birth: 03-20-1956  Transition of Care [facility]) CM/SW Contact  Joseph Art, [place] Phone Number: 06/16/2021, 10:01 AM  Clinical Narrative:     Patient pending SNF placement.  No current bed offers. Patient left [facility] AMA and is now COVID+, and will need to quarantine for ten days (06/25/2021), unless a SNF placement is willing to accept her before quarantine time of completed.   Expected Discharge Plan: Skilled Nursing Facility Barriers to Discharge: SNF Covid  Expected Discharge Plan and Services Expected Discharge Plan: Skilled Nursing Facility   Discharge Planning Services: CM Consult Post Acute Care Choice: Skilled Nursing Facility Living arrangements for the past 2 months: Single Family Home                 DME Arranged: N/A DME Agency: NA       HH Arranged: NA HH Agency: NA         Social Determinants of Health (SDOH) Interventions    Readmission Risk Interventions No flowsheet data found.

## 2021-06-16 NOTE — Evaluation (Signed)
Physical Therapy Evaluation Patient Details Name: Melissa Hunter MRN: [id number] DOB: 25-Aug-1955 Today's Date: 06/16/2021  History of Present Illness  Pt is a 65 y/o F who presents to the ED on 06/14/21. Pt was at [facility] 2/2 R ankle fx (NWB) when pt left to return home & was gone >24 hours so she subsequently lost her room. Pt found to be covid (+). PMH: GERD, HA, HTN, MS, sleep apnea  Clinical Impression  Pt seen for PT evaluation with pt known to this author. Pt with flat affect & doesn't make eye contact with PT during session. Pt reports she wasn't walking at [facility] but then upon further questioning states she was able to walk to the door with the walker while there, otherwise pt was primarily using w/c for mobility and able to perform car transfer via pivot without assistance. On this date, pt completes supine>sit with mod I & squat pivot high bed>low chair with supervision. Discussed d/c recommendations with this PT recommending pt d/c home with HHPT services, w/c, RW, and BSC as pt's house is 1 level with ramped entrance; pt reports she thinks she'll be able to go home & do this. Spoke with team re: d/c recommendations. Will continue to follow pt acutely to progress gait as able.     Recommendations for follow up therapy are one component of a multi-disciplinary discharge planning process, led by the attending physician.  Recommendations may be updated based on patient status, additional functional criteria and insurance authorization.  Follow Up Recommendations Home health PT    Assistance Recommended at Discharge Frequent or constant Supervision/Assistance  Functional Status Assessment  (Pt has not had a recent decline since last admission but is different than her PLOF prior to her fall resulting in ankle fx.)  Equipment Recommendations  Wheelchair (measurements PT);Wheelchair cushion (measurements PT);Rolling walker (2 wheels);BSC/3in1    Recommendations for Other Services        Precautions / Restrictions Precautions Precautions: Fall Precaution Comments: airborne, droplet precautions Restrictions Weight Bearing Restrictions: Yes RLE Weight Bearing: Non weight bearing      Mobility  Bed Mobility Overal bed mobility: Modified Independent Bed Mobility: Supine to Sit     Supine to sit: Modified independent (Device/Increase time);HOB elevated Sit to supine: Supervision        Transfers Overall transfer level: Needs assistance Equipment used: None Transfers: Bed to chair/wheelchair/BSC Sit to Stand: Supervision Stand pivot transfers: Supervision   Squat pivot transfers: Supervision     General transfer comment: elevated bed>low bench chair    Ambulation/Gait                  Stairs            Wheelchair Mobility    Modified Rankin (Stroke Patients Only)       Balance Overall balance assessment: Needs assistance Sitting-balance support: No upper extremity supported Sitting balance-Leahy Scale: Good Sitting balance - Comments: w/ R LE elevated   Standing balance support: Bilateral upper extremity supported Standing balance-Leahy Scale: Good                               Pertinent Vitals/Pain Pain Assessment: Faces Pain Score: 2  Faces Pain Scale: Hurts a little bit Pain Location: RLE in dependent position Pain Descriptors / Indicators: Grimacing Pain Intervention(s): Monitored during session    Home Living Family/patient expects to be discharged to:: Private residence Living Arrangements: Spouse/significant other Available  Help at Discharge: Family;Available PRN/intermittently Type of Home: House Home Access: Ramped entrance       Home Layout: One level Home Equipment: Cane - single point      Prior Function Prior Level of Function : Independent/Modified Independent             Mobility Comments: Prior to initial fall pt was independent without AD, working from home. Today pt reports  she was able to complete w/c>car transfer without assistance from her significant other, propelled w/c with her L foot, and was working on short distance walking at [facility] but primarily in w/c       Hand Dominance        Extremity/Trunk Assessment   Upper Extremity Assessment Upper Extremity Assessment: Overall WFL for tasks assessed    Lower Extremity Assessment Lower Extremity Assessment: Generalized weakness (RLE NWB) RLE Deficits / Details: trimalleolar fx w. mortise displacement, NWB       Communication   Communication: No difficulties  Cognition Arousal/Alertness: Awake/alert Behavior During Therapy: Flat affect Overall Cognitive Status: Within Functional Limits for tasks assessed                                 General Comments: AxO x 4, doesn't make eye contact with PT, flat affect        General Comments General comments (skin integrity, edema, etc.): ace bandage on R LE    Exercises Other Exercises Other Exercises: Educ re: fxl mobility while adhering to WB precautions   Assessment/Plan    PT Assessment Patient needs continued PT services  PT Problem List Decreased strength;Decreased coordination;Pain;Decreased activity tolerance;Decreased balance;Decreased mobility;Decreased knowledge of precautions;Decreased safety awareness;Obesity;Decreased knowledge of use of DME       PT Treatment Interventions DME instruction;Therapeutic exercise;Gait training;Balance training;Manual techniques;Wheelchair mobility training;Neuromuscular re-education;Modalities;Functional mobility training;Therapeutic activities;Patient/family education    PT Goals (Current goals can be found in the Care Plan section)  Acute Rehab PT Goals Patient Stated Goal: doesn't state PT Goal Formulation: With patient Time For Goal Achievement: 06/30/21 Potential to Achieve Goals: Good    Frequency Min 2X/week   Barriers to discharge        Co-evaluation                AM-PAC PT "6 Clicks" Mobility  Outcome Measure Help needed turning from your back to your side while in a flat bed without using bedrails?: None Help needed moving from lying on your back to sitting on the side of a flat bed without using bedrails?: None Help needed moving to and from a bed to a chair (including a wheelchair)?: A Little Help needed standing up from a chair using your arms (e.g., wheelchair or bedside chair)?: A Little Help needed to walk in hospital room?: A Lot Help needed climbing 3-5 steps with a railing? : Total 6 Click Score: 17    End of Session   Activity Tolerance: Patient tolerated treatment well Patient left: in chair;with call bell/phone within reach Nurse Communication: Mobility status PT Visit Diagnosis: Unsteadiness on feet (R26.81);Muscle weakness (generalized) (M62.81);Difficulty in walking, not elsewhere classified (R26.2)    Time: 9485-4627 PT Time Calculation (min) (ACUTE ONLY): 17 min   Charges:   PT Evaluation $PT Eval Low Complexity: 1 Low          Aleda Grana, PT, DPT 06/16/21, 3:28 PM   Sandi Mariscal 06/16/2021, 3:26 PM

## 2021-06-16 NOTE — ED Notes (Signed)
Pt given meal tray.

## 2021-06-16 NOTE — Evaluation (Signed)
Occupational Therapy Evaluation Patient Details Name: Melissa Hunter MRN: [id number] DOB: Jan 15, 1956 Today's Date: 06/16/2021   History of Present Illness Melissa Hunter is a 65 y.o. female who has been living at [facility] for rehab due to a trimalleolar fracture that occurred 3 to 4 weeks ago. She left [facility] yesterday, staying out overnight, and when she attempted to return this AM, the facility would not allow her back in, telling her instead to go to the ED. She has some chronic pain in her low right foot for the last month, but she has no new problems. She specifically denies fever, sore throat, chest pain, shortness of breath, nausea, vomiting, abdominal pain, and dysuria. She is scheduled for orthopedic follow-up this coming week. She has no new medical complaints or concerns.   Clinical Impression   Ms. Kath presents ~ 1 month s/p mechanical fall at home, resulting in R ankle trimalleolar fx w/ mortise displacement. Currently has ace bandage on R LE, NWB status. Pt no longer able to continue rehab process at [facility], 2/2 leaving there yesterday and not returning overnight; pt will now be returning home. Provided educ re: managing ADLs at home while adhering to WB precautions, pt expresses considerable anxiety, therapist provided encouragement, therapeutic listening. Demonstrated techniques for bed mobility, transfers, toileting, grooming with NWB on R LE. Pt verbalized understanding. Upon DC, will need HHOT, rolling walker, BSC, manual WC.      Recommendations for follow up therapy are one component of a multi-disciplinary discharge planning process, led by the attending physician.  Recommendations may be updated based on patient status, additional functional criteria and insurance authorization.   Follow Up Recommendations  Home health OT    Assistance Recommended at Discharge Intermittent Supervision/Assistance  Functional Status Assessment  Patient has had a recent  decline in their functional status and demonstrates the ability to make significant improvements in function in a reasonable and predictable amount of time.  Equipment Recommendations  BSC/3in1;Wheelchair (measurements OT);Other (comment) (RW)    Recommendations for Other Services       Precautions / Restrictions Precautions Precautions: Fall Precaution Comments: airborne, droplet precautions Restrictions Weight Bearing Restrictions: Yes RLE Weight Bearing: Non weight bearing      Mobility Bed Mobility Overal bed mobility: Needs Assistance       Supine to sit: Supervision Sit to supine: Supervision        Transfers Overall transfer level: Needs assistance Equipment used: Rolling walker (2 wheels)   Sit to Stand: Supervision Stand pivot transfers: Supervision                Balance Overall balance assessment: Needs assistance Sitting-balance support: No upper extremity supported Sitting balance-Leahy Scale: Good Sitting balance - Comments: w/ R LE elevated   Standing balance support: Bilateral upper extremity supported Standing balance-Leahy Scale: Good                             ADL either performed or assessed with clinical judgement   ADL Overall ADL's : Needs assistance/impaired                                             Vision         Perception     Praxis      Pertinent Vitals/Pain Pain Assessment: 0-10 Pain  Score: 2  Pain Descriptors / Indicators: Grimacing Pain Intervention(s): Monitored during session     Hand Dominance     Extremity/Trunk Assessment Upper Extremity Assessment Upper Extremity Assessment: Overall WFL for tasks assessed   Lower Extremity Assessment Lower Extremity Assessment: RLE deficits/detail RLE Deficits / Details: trimalleolar fx w. mortise displacement, NWB       Communication Communication Communication: No difficulties   Cognition Arousal/Alertness:  Awake/alert Behavior During Therapy: Flat affect;Anxious Overall Cognitive Status: Within Functional Limits for tasks assessed                                 General Comments: Pt tearful, flat affect, makes no eye contact, appears anxious     General Comments  ace bandage on R LE    Exercises Other Exercises Other Exercises: Educ re: fxl mobility while adhering to WB precautions   Shoulder Instructions      Home Living Family/patient expects to be discharged to:: Private residence Living Arrangements: Spouse/significant other   Type of Home: House Home Access: Ramped entrance     Home Layout: One level               Home Equipment: Cane - single point          Prior Functioning/Environment Prior Level of Function : Independent/Modified Independent                        OT Problem List: Decreased strength;Impaired balance (sitting and/or standing);Pain;Decreased range of motion;Decreased activity tolerance;Decreased knowledge of use of DME or AE      OT Treatment/Interventions:      OT Goals(Current goals can be found in the care plan section) Acute Rehab OT Goals Patient Stated Goal: to feel better OT Goal Formulation: With patient Time For Goal Achievement: 06/30/21 Potential to Achieve Goals: Good  OT Frequency:     Barriers to D/C:            Co-evaluation              AM-PAC OT "6 Clicks" Daily Activity     Outcome Measure Help from another person eating meals?: None Help from another person taking care of personal grooming?: A Little Help from another person toileting, which includes using toliet, bedpan, or urinal?: A Little Help from another person bathing (including washing, rinsing, drying)?: A Little Help from another person to put on and taking off regular upper body clothing?: A Little Help from another person to put on and taking off regular lower body clothing?: A Lot 6 Click Score: 18   End of Session  Equipment Utilized During Treatment: Rolling walker (2 wheels)  Activity Tolerance: Patient tolerated treatment well Patient left: in chair;with family/visitor present;with call bell/phone within reach  OT Visit Diagnosis: Unsteadiness on feet (R26.81);Other abnormalities of gait and mobility (R26.89)                Time: 9509-3267 OT Time Calculation (min): 15 min Charges:  OT General Charges $OT Visit: 1 Visit OT Evaluation $OT Eval Moderate Complexity: 1 Mod OT Treatments $Self Care/Home Management : 8-22 mins Latina Craver, PhD, MS, OTR/L 06/16/21, 3:14 PM

## 2021-06-16 NOTE — ED Notes (Signed)
Pt sleeping at this time.

## 2021-06-17 NOTE — ED Notes (Signed)
Provided breakfast tray

## 2021-06-17 NOTE — ED Notes (Signed)
Pt states still trying to get in touch with someone who can get her house keys. Pt currently talking on phone in room.

## 2021-06-17 NOTE — ED Notes (Signed)
Pt resting at this time, RR even and unlabored. Dinner tray at bedside

## 2021-06-17 NOTE — ED Provider Notes (Signed)
I assumed care of this patient approximately 0 700.  Please feel feeling flutters note for full details regarding patient's initial evaluation assessment.  In brief patient is currently a border pending DME needs after recent tramadol fracture.  I was informed that DME needs are an element and patient is cleared for discharge from social work perspective.  Patient Noted to be COVID-positive.  Discussed this with patient.  Discharged in stable condition.   Gilles Chiquito, MD 06/17/21 1054

## 2021-06-17 NOTE — ED Notes (Signed)
Per social work, pt is ok to go home. Per pt she will need EMS transport. Pt states she is trying to get in contact with someone else to get house keys so she can get into the house.

## 2021-06-17 NOTE — ED Provider Notes (Signed)
2:48 AM patient is awaiting placement incidentally noted COVID-positive Vitals stable    Concha Se, MD 06/17/21 [phone]

## 2021-06-17 NOTE — ED Notes (Signed)
Provided lunch tray

## 2021-06-17 NOTE — ED Notes (Signed)
Pt friend dropped off house keys. Secretary informed to call EMS transport

## 2021-06-17 NOTE — ED Notes (Signed)
Pharmacy contacted for synthroid

## 2021-07-09 ENCOUNTER — Other Ambulatory Visit: Payer: Self-pay | Admitting: Orthopaedic Surgery

## 2021-07-09 ENCOUNTER — Other Ambulatory Visit: Payer: Self-pay

## 2021-07-09 ENCOUNTER — Ambulatory Visit
Admission: RE | Admit: 2021-07-09 | Discharge: 2021-07-09 | Disposition: A | Discharge status: Home / Self Care | Payer: Medicare Other | Source: Ambulatory Visit | Attending: Orthopaedic Surgery | Admitting: Orthopaedic Surgery

## 2021-07-09 DIAGNOSIS — M25571 Pain in right ankle and joints of right foot: Secondary | ICD-10-CM

## 2021-07-10 ENCOUNTER — Ambulatory Visit: Payer: Medicare Other | Admitting: Internal Medicine

## 2021-07-12 ENCOUNTER — Other Ambulatory Visit: Payer: Self-pay | Admitting: Orthopaedic Surgery

## 2021-07-20 ENCOUNTER — Encounter (HOSPITAL_COMMUNITY): Payer: Self-pay | Admitting: Orthopaedic Surgery

## 2021-07-20 ENCOUNTER — Other Ambulatory Visit: Payer: Self-pay

## 2021-07-20 NOTE — Progress Notes (Signed)
PCP - Dr. Arlana Pouch  Cardiologist - Denies  EP- Denies  Endocrine- Denies  Pulm- Denies  Chest x-ray - Denies  EKG - 07/23/21- Day of surgery  Stress Test - Denies  ECHO - Denies  Cardiac Cath - Denies  AICD-na PM-na LOOP-na  Dialysis- Denies  Sleep Study - Denies CPAP - Denies  LABS- 07/23/21: CBC, BMP, PCR  ASA- Denies  ERAS- Yes until 0430  HA1C- Denies  Anesthesia- No  Pt denies having chest pain, sob, or fever during the pre-op phone call. All instructions explained to the pt, with a verbal understanding of the material including: as of today, stop taking all Aspirin (unless instructed by your doctor) and Other Aspirin containing products, Vitamins, Fish oils, and Herbal medications. Also stop all NSAIDS i.e. Advil, Ibuprofen, Motrin, Aleve, Anaprox, Naproxen, BC, Goody Powders, and all Supplements. Pt also instructed to wear a mask and social distance if she goes out. The opportunity to ask questions was provided.    Coronavirus Screening  Have you experienced the following symptoms:  Cough yes/no: No Fever (>100.28F)  yes/no: No Runny nose yes/no: No Sore throat yes/no: No Difficulty breathing/shortness of breath  yes/no: No  Have you or a family member traveled in the last 14 days and where? yes/no: No   If the patient indicates "YES" to the above questions, their PAT will be rescheduled to limit the exposure to others and, the surgeon will be notified. THE PATIENT WILL NEED TO BE ASYMPTOMATIC FOR 14 DAYS.   If the patient is not experiencing any of these symptoms, the PAT nurse will instruct them to NOT bring anyone with them to their appointment since they may have these symptoms or traveled as well.   Please remind your patients and families that hospital visitation restrictions are in effect and the importance of the restrictions.

## 2021-07-22 NOTE — Anesthesia Preprocedure Evaluation (Addendum)
Anesthesia Evaluation  Patient identified by MRN, date of birth, ID band Patient awake    Reviewed: Allergy & Precautions, H&P , NPO status , Patient's Chart, lab work & pertinent test results  History of Anesthesia Complications (+) Family history of anesthesia reaction  Airway Mallampati: III  TM Distance: >3 FB Neck ROM: Full    Dental no notable dental hx. (+) Teeth Intact, Dental Advisory Given   Pulmonary sleep apnea , Current SmokerPatient did not abstain from smoking.,    Pulmonary exam normal breath sounds clear to auscultation       Cardiovascular Exercise Tolerance: Good hypertension, Pt. on medications negative cardio ROS Normal cardiovascular exam Rhythm:Regular Rate:Normal     Neuro/Psych  Headaches, Anxiety MS - Fatigue   Neuromuscular disease negative psych ROS   GI/Hepatic Neg liver ROS, GERD  Controlled,  Endo/Other  negative endocrine ROSHypothyroidism Morbid obesity  Renal/GU negative Renal ROS  negative genitourinary   Musculoskeletal negative musculoskeletal ROS (+)   Abdominal   Peds negative pediatric ROS (+)  Hematology negative hematology ROS (+)   Anesthesia Other Findings   Reproductive/Obstetrics negative OB ROS                          Anesthesia Physical Anesthesia Plan  ASA: 3  Anesthesia Plan: General and Regional   Post-op Pain Management:    Induction: Intravenous  PONV Risk Score and Plan: 3 and Ondansetron  Airway Management Planned: LMA and Oral ETT  Additional Equipment: None  Intra-op Plan:   Post-operative Plan: Extubation in OR  Informed Consent: I have reviewed the patients History and Physical, chart, labs and discussed the procedure including the risks, benefits and alternatives for the proposed anesthesia with the patient or authorized representative who has indicated his/her understanding and acceptance.       Plan  Discussed with: CRNA and Anesthesiologist  Anesthesia Plan Comments: ( )       Anesthesia Quick Evaluation

## 2021-07-23 ENCOUNTER — Encounter (HOSPITAL_COMMUNITY): Payer: Self-pay | Admitting: Orthopaedic Surgery

## 2021-07-23 ENCOUNTER — Ambulatory Visit (HOSPITAL_COMMUNITY): Payer: Medicare Other

## 2021-07-23 ENCOUNTER — Ambulatory Visit (HOSPITAL_COMMUNITY)
Admission: RE | Admit: 2021-07-23 | Discharge: 2021-07-23 | Disposition: A | Discharge status: Home / Self Care | Payer: Medicare Other | Attending: Orthopaedic Surgery | Admitting: Orthopaedic Surgery

## 2021-07-23 ENCOUNTER — Other Ambulatory Visit: Payer: Self-pay

## 2021-07-23 ENCOUNTER — Encounter (HOSPITAL_COMMUNITY)
Admission: RE | Disposition: A | Discharge status: Home / Self Care | Payer: Self-pay | Source: Home / Self Care | Attending: Orthopaedic Surgery

## 2021-07-23 ENCOUNTER — Ambulatory Visit (HOSPITAL_COMMUNITY): Payer: Medicare Other | Admitting: Anesthesiology

## 2021-07-23 DIAGNOSIS — G35 Multiple sclerosis: Secondary | ICD-10-CM | POA: Insufficient documentation

## 2021-07-23 DIAGNOSIS — I10 Essential (primary) hypertension: Secondary | ICD-10-CM | POA: Insufficient documentation

## 2021-07-23 DIAGNOSIS — Z419 Encounter for procedure for purposes other than remedying health state, unspecified: Secondary | ICD-10-CM

## 2021-07-23 DIAGNOSIS — K219 Gastro-esophageal reflux disease without esophagitis: Secondary | ICD-10-CM | POA: Insufficient documentation

## 2021-07-23 DIAGNOSIS — Z6838 Body mass index (BMI) 38.0-38.9, adult: Secondary | ICD-10-CM | POA: Insufficient documentation

## 2021-07-23 DIAGNOSIS — G4730 Sleep apnea, unspecified: Secondary | ICD-10-CM | POA: Insufficient documentation

## 2021-07-23 DIAGNOSIS — E039 Hypothyroidism, unspecified: Secondary | ICD-10-CM | POA: Diagnosis not present

## 2021-07-23 DIAGNOSIS — S93431A Sprain of tibiofibular ligament of right ankle, initial encounter: Secondary | ICD-10-CM | POA: Diagnosis not present

## 2021-07-23 DIAGNOSIS — S82851A Displaced trimalleolar fracture of right lower leg, initial encounter for closed fracture: Secondary | ICD-10-CM | POA: Insufficient documentation

## 2021-07-23 DIAGNOSIS — F419 Anxiety disorder, unspecified: Secondary | ICD-10-CM | POA: Insufficient documentation

## 2021-07-23 HISTORY — PX: ORIF ANKLE FRACTURE: SHX5408

## 2021-07-23 HISTORY — DX: Anxiety disorder, unspecified: F41.9

## 2021-07-23 LAB — BASIC METABOLIC PANEL
Anion gap: 7 (ref 5–15)
BUN: 18 mg/dL (ref 8–23)
CO2: 26 mmol/L (ref 22–32)
Calcium: 9.1 mg/dL (ref 8.9–10.3)
Chloride: 105 mmol/L (ref 98–111)
Creatinine, Ser: 1.04 mg/dL — ABNORMAL HIGH (ref 0.44–1.00)
GFR, Estimated: 60 mL/min — ABNORMAL LOW (ref >60)
Glucose, Bld: 113 mg/dL — ABNORMAL HIGH (ref 70–99)
Potassium: 4.1 mmol/L (ref 3.5–5.1)
Sodium: 138 mmol/L (ref 135–145)

## 2021-07-23 LAB — CBC
HCT: 38.6 % (ref 36.0–46.0)
Hemoglobin: 12.2 g/dL (ref 12.0–15.0)
MCH: 29.9 pg (ref 26.0–34.0)
MCHC: 31.6 g/dL (ref 30.0–36.0)
MCV: 94.6 fL (ref 80.0–100.0)
Platelets: 178 10*3/uL (ref 150–400)
RBC: 4.08 MIL/uL (ref 3.87–5.11)
RDW: 14 % (ref 11.5–15.5)
WBC: 5.1 10*3/uL (ref 4.0–10.5)
nRBC: 0 % (ref 0.0–0.2)

## 2021-07-23 SURGERY — OPEN REDUCTION INTERNAL FIXATION (ORIF) ANKLE FRACTURE
Anesthesia: Regional | Site: Ankle | Laterality: Right

## 2021-07-23 MED ORDER — ONDANSETRON HCL 4 MG/2ML IJ SOLN
INTRAMUSCULAR | Status: DC | PRN
  Administered 2021-07-23: 4 mg via INTRAVENOUS

## 2021-07-23 MED ORDER — FENTANYL CITRATE (PF) 100 MCG/2ML IJ SOLN
INTRAMUSCULAR | Status: DC | PRN
  Administered 2021-07-23 (×2): 50 ug via INTRAVENOUS
  Administered 2021-07-23 (×2): 25 ug via INTRAVENOUS

## 2021-07-23 MED ORDER — BUPIVACAINE HCL (PF) 0.5 % IJ SOLN
INTRAMUSCULAR | Status: DC | PRN
  Administered 2021-07-23: 10 mL

## 2021-07-23 MED ORDER — CHLORHEXIDINE GLUCONATE 0.12 % MT SOLN
15.0000 mL | Freq: Once | OROMUCOSAL | Status: AC
  Administered 2021-07-23: 06:00:00 15 mL via OROMUCOSAL
  Filled 2021-07-23: qty 15

## 2021-07-23 MED ORDER — ASPIRIN 325 MG PO TABS: 325.0000 mg | ORAL_TABLET | Freq: Every day | ORAL | 0 refills | Status: AC

## 2021-07-23 MED ORDER — EPHEDRINE 5 MG/ML INJ
INTRAVENOUS | Status: AC
  Filled 2021-07-23: qty 5

## 2021-07-23 MED ORDER — FENTANYL CITRATE (PF) 100 MCG/2ML IJ SOLN
INTRAMUSCULAR | Status: AC
  Filled 2021-07-23: qty 2

## 2021-07-23 MED ORDER — EPHEDRINE SULFATE-NACL 50-0.9 MG/10ML-% IV SOSY
PREFILLED_SYRINGE | INTRAVENOUS | Status: DC | PRN
  Administered 2021-07-23 (×5): 5 mg via INTRAVENOUS

## 2021-07-23 MED ORDER — PROPOFOL 10 MG/ML IV BOLUS
INTRAVENOUS | Status: DC | PRN
  Administered 2021-07-23: 150 mg via INTRAVENOUS

## 2021-07-23 MED ORDER — 0.9 % SODIUM CHLORIDE (POUR BTL) OPTIME
TOPICAL | Status: DC | PRN
  Administered 2021-07-23: 08:00:00 1000 mL

## 2021-07-23 MED ORDER — MIDAZOLAM HCL 5 MG/5ML IJ SOLN
INTRAMUSCULAR | Status: DC | PRN
Start: 2021-07-23 — End: 2021-07-23
  Administered 2021-07-23: 2 mg via INTRAVENOUS

## 2021-07-23 MED ORDER — PROPOFOL 1000 MG/100ML IV EMUL
INTRAVENOUS | Status: AC
  Filled 2021-07-23: qty 100

## 2021-07-23 MED ORDER — OXYCODONE HCL 5 MG PO TABS: 5.0000 mg | ORAL_TABLET | Freq: Three times a day (TID) | ORAL | 0 refills | Status: AC | PRN

## 2021-07-23 MED ORDER — DEXAMETHASONE SODIUM PHOSPHATE 4 MG/ML IJ SOLN
INTRAMUSCULAR | Status: DC | PRN
  Administered 2021-07-23: 10 mg via INTRAVENOUS

## 2021-07-23 MED ORDER — ONDANSETRON HCL 4 MG/2ML IJ SOLN
INTRAMUSCULAR | Status: AC
  Filled 2021-07-23: qty 2

## 2021-07-23 MED ORDER — LIDOCAINE HCL (CARDIAC) PF 100 MG/5ML IV SOSY
PREFILLED_SYRINGE | INTRAVENOUS | Status: DC | PRN
  Administered 2021-07-23: 30 mg via INTRAVENOUS

## 2021-07-23 MED ORDER — ONDANSETRON HCL 4 MG/2ML IJ SOLN
4.0000 mg | Freq: Once | INTRAMUSCULAR | Status: AC | PRN
  Administered 2021-07-23: 10:00:00 4 mg via INTRAVENOUS

## 2021-07-23 MED ORDER — MIDAZOLAM HCL 2 MG/2ML IJ SOLN
INTRAMUSCULAR | Status: AC
  Filled 2021-07-23: qty 2

## 2021-07-23 MED ORDER — GLYCOPYRROLATE PF 0.2 MG/ML IJ SOSY
PREFILLED_SYRINGE | INTRAMUSCULAR | Status: DC | PRN
  Administered 2021-07-23: .2 mg via INTRAVENOUS

## 2021-07-23 MED ORDER — DEXMEDETOMIDINE (PRECEDEX) IN NS 20 MCG/5ML (4 MCG/ML) IV SYRINGE
PREFILLED_SYRINGE | INTRAVENOUS | Status: DC | PRN
  Administered 2021-07-23: 8 ug via INTRAVENOUS

## 2021-07-23 MED ORDER — LACTATED RINGERS IV SOLN: INTRAVENOUS | Status: DC

## 2021-07-23 MED ORDER — PROPOFOL 10 MG/ML IV BOLUS
INTRAVENOUS | Status: AC
  Filled 2021-07-23: qty 20

## 2021-07-23 MED ORDER — ACETAMINOPHEN 325 MG PO TABS: 325.0000 mg | ORAL_TABLET | ORAL | Status: DC | PRN

## 2021-07-23 MED ORDER — ORAL CARE MOUTH RINSE: 15.0000 mL | Freq: Once | OROMUCOSAL | Status: AC

## 2021-07-23 MED ORDER — FENTANYL CITRATE (PF) 100 MCG/2ML IJ SOLN
25.0000 ug | INTRAMUSCULAR | Status: DC | PRN
  Administered 2021-07-23: 10:00:00 25 ug via INTRAVENOUS
  Administered 2021-07-23: 10:00:00 50 ug via INTRAVENOUS
  Administered 2021-07-23 (×2): 25 ug via INTRAVENOUS

## 2021-07-23 MED ORDER — OXYCODONE HCL 5 MG PO TABS
ORAL_TABLET | ORAL | Status: AC
  Filled 2021-07-23: qty 1

## 2021-07-23 MED ORDER — CEFAZOLIN SODIUM-DEXTROSE 2-4 GM/100ML-% IV SOLN
2.0000 g | INTRAVENOUS | Status: AC
  Administered 2021-07-23: 08:00:00 2 g via INTRAVENOUS
  Filled 2021-07-23: qty 100

## 2021-07-23 MED ORDER — DEXAMETHASONE SODIUM PHOSPHATE 10 MG/ML IJ SOLN
INTRAMUSCULAR | Status: AC
  Filled 2021-07-23: qty 1

## 2021-07-23 MED ORDER — ACETAMINOPHEN 160 MG/5ML PO SOLN: 325.0000 mg | ORAL | Status: DC | PRN

## 2021-07-23 MED ORDER — OXYCODONE HCL 5 MG/5ML PO SOLN: 5.0000 mg | Freq: Once | ORAL | Status: AC | PRN

## 2021-07-23 MED ORDER — OXYCODONE HCL 5 MG PO TABS
5.0000 mg | ORAL_TABLET | Freq: Once | ORAL | Status: AC | PRN
  Administered 2021-07-23: 11:00:00 5 mg via ORAL

## 2021-07-23 MED ORDER — MEPERIDINE HCL 25 MG/ML IJ SOLN: 6.2500 mg | INTRAMUSCULAR | Status: DC | PRN

## 2021-07-23 MED ORDER — FENTANYL CITRATE (PF) 250 MCG/5ML IJ SOLN
INTRAMUSCULAR | Status: AC
  Filled 2021-07-23: qty 5

## 2021-07-23 MED ORDER — BUPIVACAINE LIPOSOME 1.3 % IJ SUSP
INTRAMUSCULAR | Status: DC | PRN
  Administered 2021-07-23: 10 mL via PERINEURAL

## 2021-07-23 MED ORDER — DEXMEDETOMIDINE (PRECEDEX) IN NS 20 MCG/5ML (4 MCG/ML) IV SYRINGE
PREFILLED_SYRINGE | INTRAVENOUS | Status: AC
  Filled 2021-07-23: qty 5

## 2021-07-23 SURGICAL SUPPLY — 74 items
ALCOHOL 70% 16 OZ (MISCELLANEOUS) ×3 IMPLANT
BAG COUNTER SPONGE SURGICOUNT (BAG) ×2 IMPLANT
BAG SURGICOUNT SPONGE COUNTING (BAG) ×1
BANDAGE ESMARK 6X9 LF (GAUZE/BANDAGES/DRESSINGS) IMPLANT
BIT DRILL 2 CANN GRADUATED (BIT) ×2 IMPLANT
BIT DRILL 2.5 CANN LNG (BIT) ×2 IMPLANT
BIT DRILL 2.6 CANN (BIT) ×2 IMPLANT
BLADE SURG 15 STRL LF DISP TIS (BLADE) ×1 IMPLANT
BLADE SURG 15 STRL SS (BLADE) ×2
BNDG COHESIVE 4X5 TAN STRL (GAUZE/BANDAGES/DRESSINGS) IMPLANT
BNDG COHESIVE 6X5 TAN STRL LF (GAUZE/BANDAGES/DRESSINGS) IMPLANT
BNDG ELASTIC 4X5.8 VLCR STR LF (GAUZE/BANDAGES/DRESSINGS) ×2 IMPLANT
BNDG ELASTIC 6X10 VLCR STRL LF (GAUZE/BANDAGES/DRESSINGS) ×3 IMPLANT
BNDG ELASTIC 6X5.8 VLCR STR LF (GAUZE/BANDAGES/DRESSINGS) ×2 IMPLANT
BNDG ESMARK 6X9 LF (GAUZE/BANDAGES/DRESSINGS)
CANISTER SUCT 3000ML PPV (MISCELLANEOUS) ×3 IMPLANT
CHLORAPREP W/TINT 26 (MISCELLANEOUS) ×6 IMPLANT
COVER SURGICAL LIGHT HANDLE (MISCELLANEOUS) ×3 IMPLANT
CUFF TOURN SGL QUICK 34 (TOURNIQUET CUFF) ×2
CUFF TOURN SGL QUICK 42 (TOURNIQUET CUFF) IMPLANT
CUFF TRNQT CYL 34X4.125X (TOURNIQUET CUFF) ×1 IMPLANT
DRAPE OEC MINIVIEW 54X84 (DRAPES) ×3 IMPLANT
DRAPE U-SHAPE 47X51 STRL (DRAPES) ×3 IMPLANT
DRSG MEPITEL 4X7.2 (GAUZE/BANDAGES/DRESSINGS) ×3 IMPLANT
DRSG PAD ABDOMINAL 8X10 ST (GAUZE/BANDAGES/DRESSINGS) ×6 IMPLANT
DRSG XEROFORM 1X8 (GAUZE/BANDAGES/DRESSINGS) ×3 IMPLANT
ELECT REM PT RETURN 9FT ADLT (ELECTROSURGICAL) ×3
ELECTRODE REM PT RTRN 9FT ADLT (ELECTROSURGICAL) ×1 IMPLANT
GAUZE SPONGE 4X4 12PLY STRL (GAUZE/BANDAGES/DRESSINGS) ×2 IMPLANT
GAUZE SPONGE 4X4 12PLY STRL LF (GAUZE/BANDAGES/DRESSINGS) ×3 IMPLANT
GAUZE XEROFORM 5X9 LF (GAUZE/BANDAGES/DRESSINGS) ×2 IMPLANT
GLOVE SRG 8 PF TXTR STRL LF DI (GLOVE) ×1 IMPLANT
GLOVE SURG ENC TEXT LTX SZ7.5 (GLOVE) ×3 IMPLANT
GLOVE SURG UNDER POLY LF SZ8 (GLOVE) ×2
GOWN STRL REUS W/ TWL LRG LVL3 (GOWN DISPOSABLE) ×1 IMPLANT
GOWN STRL REUS W/ TWL XL LVL3 (GOWN DISPOSABLE) ×1 IMPLANT
GOWN STRL REUS W/TWL LRG LVL3 (GOWN DISPOSABLE) ×2
GOWN STRL REUS W/TWL XL LVL3 (GOWN DISPOSABLE) ×2
GUIDEWIRE 1.35MM (WIRE) ×4 IMPLANT
KIT BASIN OR (CUSTOM PROCEDURE TRAY) ×3 IMPLANT
KIT TURNOVER KIT B (KITS) ×3 IMPLANT
NS IRRIG 1000ML POUR BTL (IV SOLUTION) ×3 IMPLANT
PACK ORTHO EXTREMITY (CUSTOM PROCEDURE TRAY) ×3 IMPLANT
PAD ARMBOARD 7.5X6 YLW CONV (MISCELLANEOUS) ×6 IMPLANT
PAD CAST 4YDX4 CTTN HI CHSV (CAST SUPPLIES) ×1 IMPLANT
PADDING CAST COTTON 4X4 STRL (CAST SUPPLIES) ×2
PADDING CAST COTTON 6X4 STRL (CAST SUPPLIES) ×2 IMPLANT
PLATE LOCK DIST FIB RT 5H (Plate) ×2 IMPLANT
PLATE LOCK THIRD TUBULAR 4H (Plate) ×2 IMPLANT
SCREW BONE L CORTICAL 3.5X10 (Screw) ×2 IMPLANT
SCREW CANN TI ST QF 4X42 (Screw) ×4 IMPLANT
SCREW CORT 3.5X18 THRD (Screw) ×2 IMPLANT
SCREW CORT FT 3.5X28 (Screw) ×2 IMPLANT
SCREW CORT FT 3.5X56 (Screw) ×2 IMPLANT
SCREW LOCK 18X3XVALOPRFL (Screw) IMPLANT
SCREW LOCK TI QF 3X20 (Screw) ×2 IMPLANT
SCREW LOCKING 3.0X18 (Screw) ×2 IMPLANT
SCREW LOCKING VARIABLE 3.0X16 (Screw) ×2 IMPLANT
SCREW LP TI 3.5X14MM (Screw) ×4 IMPLANT
SCREW LP TIT 3.5X32 (Screw) ×2 IMPLANT
SCREW LP TITANIUM 3.5X40 (Screw) ×2 IMPLANT
SPONGE T-LAP 18X18 ~~LOC~~+RFID (SPONGE) ×3 IMPLANT
SUCTION FRAZIER HANDLE 10FR (MISCELLANEOUS) ×2
SUCTION TUBE FRAZIER 10FR DISP (MISCELLANEOUS) ×1 IMPLANT
SUT ETHILON 3 0 PS 1 (SUTURE) ×3 IMPLANT
SUT MNCRL AB 3-0 PS2 27 (SUTURE) ×4 IMPLANT
SUT MON AB 3-0 SH 27 (SUTURE) ×2
SUT MON AB 3-0 SH27 (SUTURE) ×1 IMPLANT
SUT VIC AB 2-0 CT1 27 (SUTURE) ×6
SUT VIC AB 2-0 CT1 TAPERPNT 27 (SUTURE) ×2 IMPLANT
TOWEL GREEN STERILE (TOWEL DISPOSABLE) ×3 IMPLANT
TOWEL GREEN STERILE FF (TOWEL DISPOSABLE) ×3 IMPLANT
TUBE CONNECTING 12'X1/4 (SUCTIONS) ×1
TUBE CONNECTING 12X1/4 (SUCTIONS) ×2 IMPLANT

## 2021-07-23 NOTE — Discharge Instructions (Signed)
DR. Aizik Reh [facility]KLE SURGERY POST-OP INSTRUCTIONS   Pain Management The numbing medicine and your leg will last around 18 hours, take a dose of your pain medicine as soon as you feel it wearing off to avoid rebound pain. Keep your foot elevated above heart level.  Make sure that your heel hangs free ('floats'). Take all prescribed medication as directed. If taking narcotic pain medication you may want to use an over-the-counter stool softener to avoid constipation. You may take over-the-counter NSAIDs (ibuprofen, naproxen, etc.) as well as over-the-counter acetaminophen as directed on the packaging as a supplement for your pain and may also use it to wean away from the prescription medication.  Activity Non-weightbearing Keep splint intact  First Postoperative Visit Your first postop visit will be at least 2 weeks after surgery.  This should be scheduled when you schedule surgery. If you do not have a postoperative visit scheduled please call [phone] to schedule an appointment. At the appointment your incision will be evaluated for suture removal, x-rays will be obtained if necessary.  General Instructions Swelling is very common after foot and ankle surgery.  It often takes 3 months for the foot and ankle to begin to feel comfortable.  Some amount of swelling will persist for 6-12 months. DO NOT change the dressing.  If there is a problem with the dressing (too tight, loose, gets wet, etc.) please contact Dr. Jasmin Trumbull's office. DO NOT get the dressing wet.  For showers you can use an over-the-counter cast cover or wrap a washcloth around the top of your dressing and then cover it with a plastic bag and tape it to your leg. DO NOT soak the incision (no tubs, pools, bath, etc.) until you have approval from Dr. Tanish Prien.  Contact Dr. Adairs office or go to Emergency Room if: Temperature above 101 F. Increasing pain that is unresponsive to pain medication or elevation Excessive redness or  swelling in your foot Dressing problems - excessive bloody drainage, looseness or tightness, or if dressing gets wet Develop pain, swelling, warmth, or discoloration of your calf  

## 2021-07-23 NOTE — H&P (Signed)
PREOPERATIVE H&P  Chief Complaint: Right subacute trimalleolar ankle fracture with syndesmotic disruption  HPI: Melissa Hunter is a 65 y.o. female who presents for preoperative history and physical with a diagnosis of right subacute trimalleolar ankle fracture with syndesmotic disruption.  She sustained this injury nearly 2 months ago.  Initially she was seen in outside provider and told to follow-up with orthopedics but she was lost to follow-up initially.  She finally was able to be seen which was nearly 6 to 7 weeks out from her injury.  CT scan was ordered to look for any evidence of consolidation.  X-rays revealed subluxation laterally and displacement of the fractures.  Given these findings she was indicated for open treatment of her fracture and syndesmosis as needed.  She is here today for surgery.. Symptoms are rated as moderate to severe, and have been worsening.  This is significantly impairing activities of daily living.  She has elected for surgical management.   Past Medical History:  Diagnosis Date   Anxiety    Family history of adverse reaction to anesthesia    PTS BROTHER-AGITATED, COMBATIVE   GERD (gastroesophageal reflux disease)    H/O   Headache     MIGRAINES-RARE   Hypertension    Hypothyroidism    Neuromuscular disorder (HCC)    MS   Sleep apnea    has cpap but has not used in a long time.   Past Surgical History:  Procedure Laterality Date   BOTOX INJECTION N/A 04/08/2016   Procedure: BOTOX INJECTION;  Surgeon: Orson Ape, MD;  Location: [facility];  Service: Urology;  Laterality: N/A;   BOTOX INJECTION N/A 11/04/2016   Procedure: BOTOX INJECTION;  Surgeon: Orson Ape, MD;  Location: [facility];  Service: Urology;  Laterality: N/A;   CYSTOSCOPY N/A 04/08/2016   Procedure: CYSTOSCOPY;  Surgeon: Orson Ape, MD;  Location: [facility];  Service: Urology;  Laterality: N/A;   CYSTOSCOPY N/A 11/04/2016   Procedure: CYSTOSCOPY;  Surgeon: Orson Ape, MD;   Location: [facility];  Service: Urology;  Laterality: N/A;   STAPEDECTOMY     Social History   Socioeconomic History   Marital status: Widowed    Spouse name: Not on file   Number of children: Not on file   Years of education: Not on file   Highest education level: Not on file  Occupational History   Not on file  Tobacco Use   Smoking status: Some Days    Packs/day: 0.25    Years: 25.00    Pack years: 6.25    Types: Cigarettes   Smokeless tobacco: Never  Vaping Use   Vaping Use: Never used  Substance and Sexual Activity   Alcohol use: Not Currently    Comment: 3 GLASSESS WINE/WEEK   Drug use: No   Sexual activity: Not on file  Other Topics Concern   Not on file  Social History Narrative   Not on file   Social Determinants of Health   Financial Resource Strain: Not on file  Food Insecurity: Not on file  Transportation Needs: Not on file  Physical Activity: Not on file  Stress: Not on file  Social Connections: Not on file   History reviewed. No pertinent family history. No Known Allergies Prior to Admission medications   Medication Sig Start Date End Date Taking? Authorizing Provider  atorvastatin (LIPITOR) 20 MG tablet Take 20 mg by mouth at bedtime.   Yes [provider]  baclofen (LIORESAL) 10 MG  tablet Take 10 mg by mouth 2 (two) times daily.   Yes [provider]  Cholecalciferol (VITAMIN D) 2000 units tablet Take 2,000 Units by mouth daily.   Yes [provider]  ibuprofen (ADVIL) 200 MG tablet Take 600 mg by mouth every 8 (eight) hours as needed for moderate pain.   Yes [provider]  levothyroxine (SYNTHROID, LEVOTHROID) 125 MCG tablet Take 125 mcg by mouth daily before breakfast.   Yes [provider]  Multiple Vitamins-Minerals (MULTIVITAMIN WITH MINERALS) tablet Take 1 tablet by mouth daily.   Yes [provider]  ramipril (ALTACE) 10 MG capsule Take 10 mg by mouth 2 (two) times daily.   Yes [provider]  sertraline (ZOLOFT) 100 MG tablet Take 100 mg by mouth at bedtime.   Yes [provider]  solifenacin (VESICARE) 10 MG tablet Take 10 mg by mouth daily.   Yes [provider]  oxyCODONE-acetaminophen (PERCOCET) 5-325 MG tablet Take 1 tablet by mouth every 4 (four) hours as needed for severe pain. Patient not taking: Reported on 07/17/2021 05/22/21 05/22/22  Faythe Ghee, PA-C     Positive ROS: All other systems have been reviewed and were otherwise negative with the exception of those mentioned in the HPI and as above.  Physical Exam:  Vitals:   07/23/21 0553  BP: 136/62  Pulse: 63  Resp: 17  Temp: 97.8 F (36.6 C)  SpO2: 100%   General: Alert, no acute distress Cardiovascular: No pedal edema Respiratory: No cyanosis, no use of accessory musculature GI: No organomegaly, abdomen is soft and non-tender Skin: No lesions in the area of chief complaint Neurologic: Sensation intact distally Psychiatric: Patient is competent for consent with normal mood and affect Lymphatic: No axillary or cervical lymphadenopathy  MUSCULOSKELETAL: Right ankle in a short leg splint.  Foot is swollen.  She is able to wiggle toes.  Endorses sensation in the forefoot.  No tenderness to the forefoot or proximal to the splint.  Assessment: Right subacute trimalleolar ankle fracture nearly 2 months out from injury with lateral subluxation and some consolidation noted   Plan: Plan for open treatment of her subacute trimalleolar ankle fracture and syndesmosis.  Possible posterior fixation.  This is a subacute injury and I expect it will be more difficult due to the surrounding bony consolidation, continued displacement and need for complete mobilization of the fracture fragments.  We discussed the risks, benefits and alternatives of surgery which include but are not limited to wound healing complications, infection, nonunion, malunion, need for further surgery, damage to  surrounding structures and continued pain.  They understand there is no guarantees to an acceptable outcome.  After weighing these risks they opted to proceed with surgery.     Terance Hart, MD    07/23/2021 7:07 AM

## 2021-07-23 NOTE — Anesthesia Procedure Notes (Signed)
Procedure Name: LMA Insertion Date/Time: 07/23/2021 7:36 AM Performed by: Shary Decamp, CRNA Pre-anesthesia Checklist: Patient identified, Patient being monitored, Timeout performed, Emergency Drugs available and Suction available Patient Re-evaluated:Patient Re-evaluated prior to induction Oxygen Delivery Method: Circle system utilized Preoxygenation: Pre-oxygenation with 100% oxygen Induction Type: IV induction Ventilation: Mask ventilation without difficulty LMA Size: 4.0 Number of attempts: 1 Airway Equipment and Method: Stylet Placement Confirmation: ETT inserted through vocal cords under direct vision, positive ETCO2 and breath sounds checked- equal and bilateral Tube secured with: Tape Dental Injury: Teeth and Oropharynx as per pre-operative assessment

## 2021-07-23 NOTE — Anesthesia Postprocedure Evaluation (Signed)
Anesthesia Post Note  Patient: Melissa Hunter  Procedure(s) Performed: OPEN REDUCTION OF ANKLE DISLOCATION, OPEN REDUCTION INTERNAL FIXATION OF SUBACUTE TRIMALLEOLAR ANKLE FRACTURE WITH POSTERIOR FIXATION, SYNDESMOSIS (Right: Ankle)     Patient location during evaluation: PACU Anesthesia Type: Regional and General Level of consciousness: awake and alert Pain management: pain level controlled Vital Signs Assessment: post-procedure vital signs reviewed and stable Respiratory status: spontaneous breathing, nonlabored ventilation, respiratory function stable and patient connected to nasal cannula oxygen Cardiovascular status: blood pressure returned to baseline and stable Postop Assessment: no apparent nausea or vomiting Anesthetic complications: no   No notable events documented.  Last Vitals:  Vitals:   07/23/21 1020 07/23/21 1035  BP: 139/84 (!) 151/89  Pulse: 77 78  Resp: 14 18  Temp:  (!) 36.2 C  SpO2: 99% 100%    Last Pain:  Vitals:   07/23/21 1035  TempSrc:   PainSc: 5                  Shalissa Easterwood

## 2021-07-23 NOTE — Transfer of Care (Signed)
Immediate Anesthesia Transfer of Care Note  Patient: CORNIE MCCOMBER  Procedure(s) Performed: OPEN REDUCTION OF ANKLE DISLOCATION, OPEN REDUCTION INTERNAL FIXATION OF SUBACUTE TRIMALLEOLAR ANKLE FRACTURE WITH POSTERIOR FIXATION, SYNDESMOSIS (Right: Ankle)  Patient Location: PACU  Anesthesia Type:General  Level of Consciousness: drowsy, patient cooperative and responds to stimulation  Airway & Oxygen Therapy: Patient Spontanous Breathing and Patient connected to nasal cannula oxygen  Post-op Assessment: Report given to RN and Post -op Vital signs reviewed and stable  Post vital signs: Reviewed and stable  Last Vitals:  Vitals Value Taken Time  BP 156/91 07/23/21 0947  Temp    Pulse 88 07/23/21 0951  Resp 15 07/23/21 0951  SpO2 100 % 07/23/21 0951  Vitals shown include unvalidated device data.  Last Pain:  Vitals:   07/23/21 0620  TempSrc:   PainSc: 0-No pain      Patients Stated Pain Goal: 2 (07/20/21 1250)  Complications: No notable events documented.

## 2021-07-23 NOTE — Anesthesia Procedure Notes (Signed)
Anesthesia Regional Block: Popliteal block   Pre-Anesthetic Checklist: , timeout performed,  Correct Patient, Correct Site, Correct Laterality,  Correct Procedure, Correct Position, site marked,  Risks and benefits discussed,  Surgical consent,  Pre-op evaluation,  At surgeon's request and post-op pain management  Laterality: Right  Prep: chloraprep       Needles:  Injection technique: Single-shot  Needle Type: Echogenic Stimulator Needle     Needle Length: 5cm  Needle Gauge: 22     Additional Needles:   Procedures:, nerve stimulator,,, ultrasound used (permanent image in chart),,     Nerve Stimulator or Paresthesia:  Response: foot, 0.45 mA  Additional Responses:   Narrative:  Start time: 07/23/2021 7:18 AM End time: 07/23/2021 7:23 AM Injection made incrementally with aspirations every 5 mL.  Performed by: Personally  Anesthesiologist: Heather Roberts, MD  Additional Notes: Functioning IV was confirmed and monitors were applied.  A 49mm 22ga Arrow echogenic stimulator needle was used. Sterile prep and drape,hand hygiene and sterile gloves were used. Ultrasound guidance: relevant anatomy identified, needle position confirmed, local anesthetic spread visualized around nerve(s)., vascular puncture avoided.  Image printed for medical record. Negative aspiration and negative test dose prior to incremental administration of local anesthetic. The patient tolerated the procedure well.

## 2021-07-23 NOTE — Op Note (Signed)
[name] female 65 y.o. 07/23/2021  PreOperative Diagnosis: Subacute right trimalleolar ankle fracture dislocation Right syndesmotic disruption  PostOperative Diagnosis: Same  PROCEDURE: Open reduction internal fixation of trimalleolar ankle fracture with posterior fixation Open treatment of syndesmosis Ankle stress view fluoroscopy  SURGEON: Dub Mikes, MD  ASSISTANT: Jesse [place], PA-C: His assistance was necessary for prep and drape, exposure, holding retractors, assistance with cleaning out of the fracture site, assistance with reduction and provisional fixation of the fracture site, wound closure and splinting.   ANESTHESIA: General LMA with popliteal nerve block  FINDINGS: Persistent, subacute ankle dislocation with trimalleolar fracture Significant mount of callus formation around the lateral and medial malleolus with comminution and displacement  IMPLANTS: Arthrex distal fibular locking plate One third tubular plate 4.0 cannulated screws 3.5 fully threaded screws  INDICATIONS:65 y.o. female sustained a trimalleolar ankle fracture dislocation approximately 8 weeks ago.  She was lost to follow-up initially and showed up in the clinic with persistently dislocated ankle and displacement of her fracture with CT evidence of significant amount of callus formation on the lateral medial fracture but no evidence of bony consolidation.  She had evidence of syndesmotic widening.  Unfortunately she had not sought treatment initially and had a terrible ankle injury at baseline.  We had a lengthy discussion about less than optimal outcome due to the delayed presentation of her injury and persistent dislocation.  She understood the possibility of continued pain after her surgery.   Patient understood the risks, benefits and alternatives to surgery which include but are not limited to wound healing complications, infection, nonunion, malunion, need for further surgery as well  as damage to surrounding structures. They also understood the potential for continued pain in that there were no guarantees of acceptable outcome After weighing these risks the patient opted to proceed with surgery.  PROCEDURE: Patient was identified in the preoperative holding area.  The right leg was marked by myself.  Consent was signed by myself and the patient.  Block was performed by anesthesia in the preoperative holding area.  Patient was taken to the operative suite and placed supine on the operative table.  General LMA anesthesia was induced without difficulty. Bump was placed under the operative hip and bone foam was used.  All bony prominences were well padded.  Tourniquet was placed on the operative thigh.  Preoperative antibiotics were given. The extremity was prepped and draped in the usual sterile fashion and surgical timeout was performed.  The limb was elevated and the tourniquet was inflated to 250 mmHg.   We began by making a longitudinal incision overlying the fibula.  This was taken sharply down through skin and subcutaneous tissue.  Blunt dissection was used to identify any branch of the superficial peroneal nerve that was not identified and protected through the entirety of the case.  The incision was then taken sharply down to bone and the fracture site was identified.  There was a significant amount of displacement and fracture callus around the fracture anteriorly, laterally and posteriorly.  Using a rondure were able to mobilize the fracture callus and remove it.  The fracture was solid with fibrous tissue within the area.  Using an osteotome and a 15 blade the fracture was taken down.  It was mobilized.  With medially directed force were able to break open the fracture site and cleaned it out with a rondure and a curette of fibrous tissue and calcified fibrous tissue about the fracture site.  This took several minutes  and was quite difficult to fully mobilize the fracture.  There  has been some resorption of the fracture ends after removal of the fibers and callus tissue.  Approximately 20 to 30 minutes was spent removing fracture callus and mobilizing the lateral malleolus.    We then turned our attention to the medial malleolus.  There was continued displacement of the medial malleolus and therefore an incision was made overlying this.  This was taken sharply down through skin and subcutaneous tissue.  Bovie cautery was used for skin bleeders.  Then sharp dissection down to the fracture and the fracture site was identified.  The soft tissue flap was carried anteriorly to identify the medial gutter of the ankle joint.  There was a significant amount of fracture callus about the fracture site.  It knife and a osteotome was placed within the fracture to try to mobilize it.  Rondure was used to remove the calcified fracture callus about the fracture laterally anteriorly and medially about the fracture.  Once the fracture was fully mobilized were able to see into the joint.  Approximately 15 minutes was used clearing out the fracture hematoma which was excessively long compared to a known subacute fracture.  This added significant difficulty to the case.  Once the fracture site was mobilized and using a curette and rondure the fracture was cleared out of hematoma and fracture callus.  Then the fracture was reduced and held provisionally with a pointed reduction forcep.  This was done under direct visualization.  Then fluoroscopy confirmed adequate reduction.  There was softening of the bone ends and therefore a buttress plate was placed.  We are able to place a one third tubular plate in buttress fashion overlying the medial malleolus once it was adequately reduced.  This was done with 3 proximal screws.  A small locking screw was then placed distally within the medial malleolus fracture.  Then 2 4.0 mm partially-threaded cannulated screws were placed across the fracture site with good  fixation.    We turned our attention back to the lateral malleolus.  Fluoroscopy confirmed acceptable reduction of the lateral malleolus and a distal fibular locking plate was placed.  There was significant amount of fibrous tissue and scarring within the syndesmosis due to persistent dislocation.  Rondure was used to clear out the fibrous tissue within the syndesmosis.  Once the syndesmosis was cleared we are able to better reduce the lateral malleolus.  There was some persistent shortening due to bone loss but this was deemed acceptable.  We are able to peer into the joint and there is no obvious cartilage damage on the lateral talar dome.  The plate was placed with combination of locking and nonlocking screws.  There is good fixation.  We then proceeded with stress view fluoroscopy of the ankle.  It was noted on stress views that there is widening of the syndesmosis and medial clear space.  We then proceeded with open treatment of the syndesmosis.   The syndesmosis was in cleared of any interposed tissue using a rongeur and a Therapist, nutritional.  Then the syndesmosis was reduced under direct position and held provisionally with a Weber clamp.  Fluoroscopy confirmed appropriate position of the syndesmosis and closing down the mortise.  We then proceeded placed a single solid three 5 mm screw across the syndesmosis into the medial cortex of the tibia.  This provided good stability of the syndesmosis.  Postplacement stress views were stable.  Then lateral x-rays were obtained in the  posterior malleolar fragment appeared to be well reduced but was large enough for internal fixation and therefore we proceeded with internal fixation of the posterior fragment.  Through the medial buttress plate we are able to gain access to the posterior malleolar fragment.  Solid screw was placed through the plate into the posterior malleolar fragment for stabilization.   Final films were obtained.  The wounds were irrigated  and the subcuticular tissue was closed with 3-0 Monocryl and the skin with staples.  Xeroform was placed on the wounds as well as 4 x 4's and sterile sheet cotton.  The tourniquet was released.    Patient was placed in a nonweightbearing short leg splint.  Patient tolerated the procedure well.  There were no complications.  Patient was awakened from anesthesia and taken recovery in stable condition.  POST OPERATIVE INSTRUCTIONS: Nonweightbearing on operative extremity Keep splint dry and limb elevated Continue 325 mg aspirin for DVT prophylaxis Call the office with concerns Follow-up in 2 weeks for splint removal, x-rays of the operative ankle, nonweightbearing and suture removal if appropriate.     TOURNIQUET TIME: Less than 2 hours  BLOOD LOSS:  less than 50 mL         DRAINS: none         SPECIMEN: none       COMPLICATIONS:  * No complications entered in OR log *         Disposition: PACU - hemodynamically stable.         Condition: stable

## 2021-07-25 ENCOUNTER — Encounter (HOSPITAL_COMMUNITY): Payer: Self-pay | Admitting: Orthopaedic Surgery

## 2022-02-26 ENCOUNTER — Other Ambulatory Visit: Payer: Self-pay | Admitting: Neurology

## 2022-02-26 DIAGNOSIS — G35 Multiple sclerosis: Secondary | ICD-10-CM

## 2022-03-10 ENCOUNTER — Ambulatory Visit
Admission: RE | Admit: 2022-03-10 | Discharge: 2022-03-10 | Disposition: A | Discharge status: Home / Self Care | Payer: Medicare Other | Source: Ambulatory Visit | Attending: Neurology | Admitting: Neurology

## 2022-03-10 DIAGNOSIS — G35 Multiple sclerosis: Secondary | ICD-10-CM | POA: Insufficient documentation

## 2022-03-10 DIAGNOSIS — G35D Multiple sclerosis, unspecified: Secondary | ICD-10-CM

## 2022-03-10 MED ORDER — GADOBUTROL 1 MMOL/ML IV SOLN
10.0000 mL | Freq: Once | INTRAVENOUS | Status: AC | PRN
  Administered 2022-03-10: 10 mL via INTRAVENOUS

## 2022-08-01 ENCOUNTER — Emergency Department: Payer: Medicare Other

## 2022-08-01 ENCOUNTER — Other Ambulatory Visit: Payer: Self-pay

## 2022-08-01 ENCOUNTER — Encounter: Payer: Self-pay | Admitting: Emergency Medicine

## 2022-08-01 ENCOUNTER — Emergency Department
Admission: EM | Admit: 2022-08-01 | Discharge: 2022-08-01 | Disposition: A | Discharge status: Home / Self Care | Payer: Medicare Other | Attending: Emergency Medicine | Admitting: Emergency Medicine

## 2022-08-01 DIAGNOSIS — J1008 Influenza due to other identified influenza virus with other specified pneumonia: Secondary | ICD-10-CM | POA: Diagnosis not present

## 2022-08-01 DIAGNOSIS — Z20822 Contact with and (suspected) exposure to covid-19: Secondary | ICD-10-CM | POA: Insufficient documentation

## 2022-08-01 DIAGNOSIS — J101 Influenza due to other identified influenza virus with other respiratory manifestations: Secondary | ICD-10-CM

## 2022-08-01 DIAGNOSIS — J181 Lobar pneumonia, unspecified organism: Secondary | ICD-10-CM | POA: Insufficient documentation

## 2022-08-01 DIAGNOSIS — J189 Pneumonia, unspecified organism: Secondary | ICD-10-CM

## 2022-08-01 DIAGNOSIS — R059 Cough, unspecified: Secondary | ICD-10-CM | POA: Diagnosis present

## 2022-08-01 LAB — CBC
HCT: 43.5 % (ref 36.0–46.0)
Hemoglobin: 14.1 g/dL (ref 12.0–15.0)
MCH: 28.8 pg (ref 26.0–34.0)
MCHC: 32.4 g/dL (ref 30.0–36.0)
MCV: 89 fL (ref 80.0–100.0)
Platelets: 158 10*3/uL (ref 150–400)
RBC: 4.89 MIL/uL (ref 3.87–5.11)
RDW: 13.1 % (ref 11.5–15.5)
WBC: 5.3 10*3/uL (ref 4.0–10.5)
nRBC: 0 % (ref 0.0–0.2)

## 2022-08-01 LAB — COMPREHENSIVE METABOLIC PANEL
ALT: 19 U/L (ref 0–44)
AST: 28 U/L (ref 15–41)
Albumin: 3.9 g/dL (ref 3.5–5.0)
Alkaline Phosphatase: 64 U/L (ref 38–126)
Anion gap: 12 (ref 5–15)
BUN: 13 mg/dL (ref 8–23)
CO2: 22 mmol/L (ref 22–32)
Calcium: 9.1 mg/dL (ref 8.9–10.3)
Chloride: 103 mmol/L (ref 98–111)
Creatinine, Ser: 0.9 mg/dL (ref 0.44–1.00)
GFR, Estimated: >60 mL/min (ref >60)
Glucose, Bld: 145 mg/dL — ABNORMAL HIGH (ref 70–99)
Potassium: 4.2 mmol/L (ref 3.5–5.1)
Sodium: 137 mmol/L (ref 135–145)
Total Bilirubin: 1.7 mg/dL — ABNORMAL HIGH (ref 0.3–1.2)
Total Protein: 7 g/dL (ref 6.5–8.1)

## 2022-08-01 LAB — RESP PANEL BY RT-PCR (RSV, FLU A&B, COVID)  RVPGX2
Influenza A by PCR: POSITIVE — AB
Influenza B by PCR: NEGATIVE
Resp Syncytial Virus by PCR: NEGATIVE
SARS Coronavirus 2 by RT PCR: NEGATIVE

## 2022-08-01 MED ORDER — CEFDINIR 300 MG PO CAPS
300.0000 mg | ORAL_CAPSULE | Freq: Two times a day (BID) | ORAL | 0 refills | Status: AC
Start: 2022-08-01 — End: 2022-08-11

## 2022-08-01 MED ORDER — AZITHROMYCIN 250 MG PO TABS: ORAL_TABLET | ORAL | 0 refills | Status: AC

## 2022-08-01 MED ORDER — IPRATROPIUM-ALBUTEROL 0.5-2.5 (3) MG/3ML IN SOLN
3.0000 mL | Freq: Once | RESPIRATORY_TRACT | Status: AC
Start: 2022-08-01 — End: 2022-08-01
  Administered 2022-08-01: 3 mL via RESPIRATORY_TRACT
  Filled 2022-08-01: qty 3

## 2022-08-01 NOTE — ED Provider Notes (Signed)
[facility] Provider Note    Event Date/Time   First MD Initiated Contact with Patient 08/01/22 0800     (approximate)   History   Weakness   HPI  Melissa Hunter is a 66 y.o. female who presents today for evaluation of cough, nasal congestion, and bodyaches for the past 3 days.  Patient reports that her cough has been productive.  She reports that she is nauseous but has not had any vomiting.  She has been drinking fluids.  She is unsure if she has had fever.  She reports that she had a fall when trying to use the bathroom this morning, she has been able to walk since.  There is no head strike or LOC.  She denies any injury sustained. No chest pain but she reports that she has mild shortness of breath.  There are no problems to display for this patient.         Physical Exam   Triage Vital Signs: ED Triage Vitals  Enc Vitals Group     BP 08/01/22 0539 (!) 140/77     Pulse Rate 08/01/22 0539 79     Resp 08/01/22 0539 18     Temp 08/01/22 0539 99.3 F (37.4 C)     Temp Source 08/01/22 0539 Oral     SpO2 08/01/22 0539 94 %     Weight 08/01/22 0540 257 lb (116.6 kg)     Height 08/01/22 0540 5\' 8"  (1.727 m)     Head Circumference --      Peak Flow --      Pain Score 08/01/22 0539 4     Pain Loc --      Pain Edu? --      Excl. in [place]? --     Most recent vital signs: Vitals:   08/01/22 0539 08/01/22 0847  BP: (!) 140/77 (!) 149/71  Pulse: 79 76  Resp: 18 (!) 22  Temp: 99.3 F (37.4 C) 99.4 F (37.4 C)  SpO2: 94% 92%    Physical Exam Vitals and nursing note reviewed.  Constitutional:      General: Awake and alert. No acute distress.    Appearance: Normal appearance. The patient is overweight.  HENT:     Head: Normocephalic and atraumatic.     Mouth: Mucous membranes are moist.  Nasal congestion present Eyes:     General: PERRL. Normal EOMs        Right eye: No discharge.        Left eye: No discharge.     Conjunctiva/sclera:  Conjunctivae normal.  Cardiovascular:     Rate and Rhythm: Normal rate and regular rhythm.     Pulses: Normal pulses.  Pulmonary:     Effort: Pulmonary effort is normal. No respiratory distress. Able to speak easily in complete sentences    Breath sounds:  Faint expiratory wheezes noted.  No tachypnea.  No accessory muscle use. Abdominal:     Abdomen is soft. There is no abdominal tenderness. No rebound or guarding. No distention. Musculoskeletal:        General: No swelling. Normal range of motion.     Cervical back: Normal range of motion and neck supple.  Skin:    General: Skin is warm and dry.     Capillary Refill: Capillary refill takes less than 2 seconds.     Findings: No rash.  Neurological:     Mental Status: The patient is awake and alert.  ED Results / Procedures / Treatments   Labs (all labs ordered are listed, but only abnormal results are displayed) Labs Reviewed  RESP PANEL BY RT-PCR (RSV, FLU A&B, COVID)  RVPGX2 - Abnormal; Notable for the following components:      Result Value   Influenza A by PCR POSITIVE (*)    All other components within normal limits  COMPREHENSIVE METABOLIC PANEL - Abnormal; Notable for the following components:   Glucose, Bld 145 (*)    Total Bilirubin 1.7 (*)    All other components within normal limits  CBC     EKG     RADIOLOGY I independently reviewed and interpreted imaging and agree with radiologists findings.     PROCEDURES:  Critical Care performed:   Procedures   MEDICATIONS ORDERED IN ED: Medications  ipratropium-albuterol (DUONEB) 0.5-2.5 (3) MG/3ML nebulizer solution 3 mL (3 mLs Nebulization Given 08/01/22 0818)     IMPRESSION / MDM / ASSESSMENT AND PLAN / ED COURSE  I reviewed the triage vital signs and the nursing notes.   Differential diagnosis includes, but is not limited to, influenza, COVID-19, pneumonia, bronchitis.  Patient is awake and alert, hemodynamically stable and afebrile.  She  has faint expiratory wheezes bilaterally and was given a DuoNeb with significant improvement of her symptoms.  Upon reevaluation, she reports that she feels much better and would like to be discharged.  Her flu is positive for influenza A.  Her x-ray obtained in triage is suggestive of a possible left lower lobe pneumonia and she was started on antibiotics for this.  We discussed close outpatient follow-up and strict return precautions.  Patient understands and agrees with plan.  She was discharged in stable condition.   Patient's presentation is most consistent with acute complicated illness / injury requiring diagnostic workup.   Clinical Course as of 08/01/22 1108  Fri Aug 01, 2022  0855 On re-evaluation patient reports she feels significantly improved and ready for discharge [JP]    Clinical Course User Index [JP] Miguelina Fore, Herb Grays, PA-C     FINAL CLINICAL IMPRESSION(S) / ED DIAGNOSES   Final diagnoses:  Pneumonia of left lower lobe due to infectious organism  Influenza A     Rx / DC Orders   ED Discharge Orders          Ordered    azithromycin (ZITHROMAX Z-PAK) 250 MG tablet        08/01/22 0856    cefdinir (OMNICEF) 300 MG capsule  2 times daily        08/01/22 2229             Note:  This document was prepared using Dragon voice recognition software and may include unintentional dictation errors.   Keturah Shavers 08/01/22 1108    Jene Every, MD 08/01/22 [phone]

## 2022-08-01 NOTE — ED Triage Notes (Signed)
Pt in with co coughing since yesterday, States  productive at times. Has had nausea with some vomiting. Has been drinking fluids, states this am at 0400 got up and fell do to weakness. States has superficial scratch to right lower leg.

## 2022-08-01 NOTE — Discharge Instructions (Signed)
You were found to have a pneumonia in your left lower lobe as well as influenza A.  Please take antibiotics as prescribed.  You may continue to take Tylenol/ibuprofen per package instructions as needed for your symptoms.  Please return for any new, worsening, or change in symptoms or other concerns. Please follow up with your outpatient provider this week.

## 2022-08-27 NOTE — Progress Notes (Signed)
 Today the history is gathered from: 100% - patient  0% - patient alone  REFERRING PHYSICIAN: Corlis Honor BROCKS, MD PRIMARY CARE PHYSICIAN:  Corlis Honor BROCKS, MD    IMPRESSION/PLAN  Melissa Hunter is a 67 y.o.  female presenting for evaluation of  MULTIPLE SCLEROSIS / FATIGUE/ SLEEP DISORDER/ ANXIETY  - Stable. -  Patient with no recent MS symptoms and ongoing sporadic sleep cycle. Reports managing anxiety well and managing medications effectively. - Continue Trazodone 50 mg at night around 30 minutes before bed. Can take 100 mg when needed. Refilled.  - Continue taking Baclofen  10 mg twice daily. Refilled.  - Continue taking Zoloft  150 mg daily. Refilled. - Order brain and cervical MRI W WO contrast in one year.   DIFFICULTY WALKING/ FALLS - Stable. - Patient with recent fall in December 2023 following weakness. Reported to ED additionally with pneumonia and influenza. - Start aquatic therapy at the [facility]. - Will continue to monitor    Follow up with Dr. Lane in 6 months.   CHIEF COMPLAIN & HISTORY OF PRESENT ILLNESS:  Melissa Hunter is a 67 y.o. female presenting for evaluation of: Chief Complaint  Patient presents with  . MULTIPLE SCLEROSIS / FATIGUE/ SLEEP DISORDER/ ANXIETY        . Difficulty Walking  . Fall    MULTIPLE SCLEROSIS / FATIGUE/ SLEEP DISORDER/ ANXIETY / STRESS Patient denies recent MS symptoms. Notes managing stress well by watching TV. States recent relative passed away in her home. States not currently seeing PT.  Notes managing anxiety well. Given Cefdinir  300 m Azithromycin  250 mg tablets 6 pak. Denies weakness, numbness and tingling. Notes sporadic sleeping routine. Notes difficulty falling asleep and staying asleep. Notes getting 5-6 hours of sleep a night. Taking Trazodone 50 mg thirty minutes before bed. Can take 100 mg as necessary. Taking Baclofen  10 mg twice daily. Taking Zoloft  150 mg daily. Denies side effects. Notes medications are effective.   DIFFICULTY  WALKING/ FALLS Recent ED visit due to fall from weakness on August 01 2022. Had pneumonia and influenza December 2023. Reports she fell in exact place relative fell. States using walker to ambulate occasionally. Endorses interest in aquatic therapy at the [facility].  DATA SUMMARIES: 03/10/22 MRI CERVICAL SPINE W WO CONTRAST IMPRESSION:  Stable cord lesions reflecting chronic demyelination. No abnormal  enhancement.  Multilevel degenerative changes as detailed above. Increased disc  herniation at C4-C5 with greater flattening of the left ventral  cord.   MRI BRAIN W WO CONTRAST IMPRESSION:  New focus of T2 hyperintensity in the right frontal white matter  reflecting gliosis or demyelination. No abnormal enhancement.  Otherwise stable burden of demyelinating disease.     04/26/2020  MRI CERVICAL SPINE W, WITHOUT  IMPRESSION:  Multiple lesions in the cervical spinal cord compatible with  multiple sclerosis. No new or enhancing lesions.  Extensive cervical spondylosis causing multilevel foraminal  encroachment. Left-sided disc protrusion at C4-5 has improved in the  interval.   MRI BRAIN WITH AND WITHOUT 04/26/2020 IMPRESSION:  Multiple lesions in the cervical spinal cord compatible with  multiple sclerosis. No new or enhancing lesions.   Extensive cervical spondylosis causing multilevel foraminal  encroachment. Left-sided disc protrusion at C4-5 has improved in the  interval.    VISIT SUMMARIES:  07/10/2020: Patient with history of MS, fatigue, difficulty walking, and sleep disorder, ongoing. Increase Zoloft  to 100 mg nightly. Continue Avonex injections, will call in 6 months of refills. Continue baclofen  10 mg twice a day, refilled.  04/03/2020: - Stable. Patient with long history of MS.  Taking avonex injections weekly with ongoing fatigue.Continue Avonex injections for now. Will repeat imaging, if no new changes will discontinue avonex.Order brain and cervical MRI with and  without contrast to evaluate if any progression in MelissaCheck CBC, CMP, and Vit D.Continue baclofen  10 mg twice a day, refilled.  07/21/2017: Continue Avonex injection, check CBC, CMP, Vitd D.  Medications previously tried (reason stopped): Tecfidera (increased WBC count)  MEDICATIONS Outpatient Medications Marked as Taking for the 08/27/22 encounter (Office Visit) with Lane Arthea Locus, MD  Medication Sig  . atorvastatin  (LIPITOR) 10 MG tablet Take 10 mg by mouth once daily.  . baclofen  (LIORESAL ) 10 MG tablet TAKE 1 TABLET BY MOUTH TWICE  DAILY  . cholecalciferol  (VITAMIN D3) 2,000 unit capsule Take 2,000 Units by mouth once daily.  . levothyroxine  (SYNTHROID ) 125 MCG tablet Take 125 mcg by mouth once daily. Take on an empty stomach with a glass of water at least 30-60 minutes before breakfast.  . mirabegron (MYRBETRIQ) 25 mg ER Tablet Take 25 mg by mouth once daily  . ramipril  (ALTACE ) 10 MG capsule Take 20 mg by mouth once daily    . sertraline  (ZOLOFT ) 100 MG tablet Take 1.5 tablets (150 mg total) by mouth once daily    ALLERGIES No Known Allergies   EXAM   Vitals:   08/27/22 1526  BP: (!) 141/86  Pulse: 69  Height: 172.7 cm (5' 7.99)  PainSc: 0-No pain     Body mass index is 39.39 kg/m.  The baseline comprehensive neurological exam obtained by Rosina Dys, NP on 04/03/2020 is included below.  Significant changes from today's visit are shown in bold.  GENERAL: Pleasant female, in no apparent distress Normocephalic and atraumatic.  MUSCULOSKELETAL: Bulk - Obese Tone - Normal Pronator Drift - Absent bilaterally. Ambulation - Gait and station are mild to moderately unsteady   NEUROLOGICAL: MENTAL STATUS: Patient is oriented to person, place and time.   Recent memory is intact.   Remote memory is intact.   Attention span and concentration are intact.   Naming and repetition are intact. Comprehension is intact.   Expressive speech is intact.   Patient's  fund of knowledge is normal for educational level.  CRANIAL NERVES: Visual acuity and visual fields are intact         Extraocular muscles are intact                        Facial sensation is intact bilaterally                Facial strength is intact bilaterally                   Hearing is intact bilaterally                              Palate elevates midline, normal phonation      COORDINATION/CEREBELLAR: Finger to nose testing is not tested today      PAST MEDICAL HISTORY Past Medical History:  Diagnosis Date  . Difficulty walking 12/22/2013  . Fatigue 12/22/2013  . MS (multiple sclerosis) (CMS-HCC) 12/22/2013  . Multiple sclerosis (CMS-HCC)   . Obesity 12/22/2013  . Sleep disorder 12/22/2013    PAST SURGICAL HISTORY Past Surgical History:  Procedure Laterality Date  . STAPEDECTOMY W/REESTABLISHMENT      FAMILY HISTORY Family History  Problem  Relation Age of Onset  . Heart disease Mother   . Kidney failure Father     SOCIAL HISTORY  Social History   Tobacco Use  . Smoking status: Every Day    Packs/day: 0.75    Years: 20.00    Additional pack years: 0.00    Total pack years: 15.00    Types: Cigarettes    Last attempt to quit: 02/24/2022    Years since quitting: 0.5    Passive exposure: Current  . Smokeless tobacco: Never  Vaping Use  . Vaping Use: Never used  Substance Use Topics  . Drug use: No    Types: Marijuana    Comment: occasional     REVIEW OF SYSTEMS:  More than 10 system ROS form was given to the patient to complete and I have reviewed it.  The form was sent for scan to the patient's EHR.  Pertinent positives are mentioned above in the HPI.   DATA  I have personally reviewed all of the data outlined below both prior to the appointment and during the appointment with the patient as appropriate. ___________________________________________________________  IMAGING: 06/14/2016  Multiple sclerosis. Abnormal grip strength and balance.  Urinary incontinence recently.  EXAM: MRI CERVICAL SPINE WITHOUT CONTRAST  TECHNIQUE: Multiplanar, multisequence MR imaging of the cervical spine was performed. No intravenous contrast was administered.  COMPARISON:  None.  FINDINGS: Alignment: Normal  Vertebrae: No fracture or primary bone lesion.  Cord: Multiple foci of abnormal cord T2 signal consistent with multiple sclerosis involvement. This is present dorsally at C2-3, ventrally at C4 are, dorsally at C6 and dorsally at C7-T1.  Posterior Fossa, vertebral arteries, paraspinal tissues: As described on the brain study.  Disc levels:  C3-4:  Uncovertebral osteophytes cause bilateral foraminal stenosis.  C4-5: Left posterior lateral osteophyte in disc herniation indents the left side of the cord and encroaches upon the intervertebral foramen on the left.  C5-6: Spondylosis more pronounced towards the right with foraminal narrowing right more than left.  C6-7:  Disc bulge.  Mild foraminal narrowing right more than left.  C7-T1:  Normal interspace.  IMPRESSION: Cord lesions consistent with multiple sclerosis involvement as described above. The cord is not swollen. These are presumed chronic, but I do not have any comparison study.  C3-4: Bilateral uncovertebral osteophytes that cause foraminal encroachment could affect either C4 nerve root.  C4-5: Left posterior lateral osteophyte in disc herniation could compress the left C5 nerve root.  C5-6: Mild foraminal narrowing right more than left without definite neural compression.  C6-7: Mild foraminal narrowing right more than left without definite neural compression.   Chronic diagnosis of multiple sclerosis. Worsening grip strength and balance disturbance. Urinary incontinence. ___________________________________________________________  EXAM: MRI HEAD WITHOUT CONTRAST  TECHNIQUE: Multiplanar, multiecho pulse sequences of the brain and  surrounding structures were obtained without intravenous contrast.  COMPARISON:  11/12/2012  FINDINGS: Brain: Diffusion imaging does not show any acute or subacute infarction or true restricted diffusion. The brainstem and cerebellum are normal. Pattern of abnormal white matter lesions throughout the deep and subcortical white matter in the corpus callosum is precisely stable. No new, enlarging or changed lesions. No cortical insult. No mass lesion, hemorrhage or hydrocephalus.  Vascular: Major vessels at the base of the brain show flow.  Skull and upper cervical spine: Negative  Sinuses/Orbits: Clear  Other: Negative  IMPRESSION: Precisely stable since the study of 11/12/2012. Multiple lesions of the cerebral hemispheric white matter and corpus callosum, unchanged in size and signal characteristics.  No new lesions. Findings consistent with stable demyelinating disease. ___________________________________________________________  MRI BRAIN  11/12/12 IMPRESSION: Progressive changes of MS.   ___________________________________________________________   No visits with results within 6 Month(s) from this visit.  Latest known visit with results is:  Office Visit on 04/03/2020  Component Date Value Ref Range Status  . WBC (White Blood Cell Count) 04/03/2020 6.3  4.1 - 10.2 10^3/uL Final  . RBC (Red Blood Cell Count) 04/03/2020 4.91  4.04 - 5.48 10^6/uL Final  . Hemoglobin 04/03/2020 14.3  12.0 - 15.0 gm/dL Final  . Hematocrit 91/68/7978 45.3  35.0 - 47.0 % Final  . MCV (Mean Corpuscular Volume) 04/03/2020 92.3  80.0 - 100.0 fl Final  . MCH (Mean Corpuscular Hemoglobin) 04/03/2020 29.1  27.0 - 31.2 pg Final  . MCHC (Mean Corpuscular Hemoglobin * 04/03/2020 31.6 (L)  32.0 - 36.0 gm/dL Final  . Platelet Count 04/03/2020 194  150 - 450 10^3/uL Final  . RDW-CV (Red Cell Distribution Widt* 04/03/2020 13.1  11.6 - 14.8 % Final  . MPV (Mean Platelet Volume) 04/03/2020 10.1  9.4 -  12.4 fl Final  . Neutrophils 04/03/2020 4.83  1.50 - 7.80 10^3/uL Final  . Lymphocytes 04/03/2020 0.89 (L)  1.00 - 3.60 10^3/uL Final  . Monocytes 04/03/2020 0.45  0.00 - 1.50 10^3/uL Final  . Eosinophils 04/03/2020 0.12  0.00 - 0.55 10^3/uL Final  . Basophils 04/03/2020 0.03  0.00 - 0.09 10^3/uL Final  . Neutrophil % 04/03/2020 76.2 (H)  32.0 - 70.0 % Final  . Lymphocyte % 04/03/2020 14.1  10.0 - 50.0 % Final  . Monocyte % 04/03/2020 7.1  4.0 - 13.0 % Final  . Eosinophil % 04/03/2020 1.9  1.0 - 5.0 % Final  . Basophil% 04/03/2020 0.5  0.0 - 2.0 % Final  . Immature Granulocyte % 04/03/2020 0.2  <=0.7 % Final  . Immature Granulocyte Count 04/03/2020 0.01  <=0.06 10^3/L Final  . Glucose 04/03/2020 105  70 - 110 mg/dL Final  . Sodium 91/68/7978 140  136 - 145 mmol/L Final  . Potassium 04/03/2020 4.4  3.6 - 5.1 mmol/L Final  . Chloride 04/03/2020 105  97 - 109 mmol/L Final  . Carbon Dioxide (CO2) 04/03/2020 29.6  22.0 - 32.0 mmol/L Final  . Urea Nitrogen (BUN) 04/03/2020 14  7 - 25 mg/dL Final  . Creatinine 91/68/7978 1.1  0.6 - 1.1 mg/dL Final  . Glomerular Filtration Rate (eGFR) 04/03/2020 50 (L)  >60 mL/min/1.73sq m Final  . Calcium  04/03/2020 9.8  8.7 - 10.3 mg/dL Final  . AST  91/68/7978 11  8 - 39 U/L Final  . ALT  04/03/2020 9  5 - 38 U/L Final  . Alk Phos (alkaline Phosphatase) 04/03/2020 55  34 - 104 U/L Final  . Albumin 04/03/2020 4.3  3.5 - 4.8 g/dL Final  . Bilirubin, Total 04/03/2020 0.9  0.3 - 1.2 mg/dL Final  . Protein, Total 04/03/2020 6.2  6.1 - 7.9 g/dL Final  . A/G Ratio 91/68/7978 2.3  1.0 - 5.0 gm/dL Final  . Vitamin D , 25-Hydroxy - [facility] 04/03/2020 65.5  30.0 - 100.0 ng/mL Final   No follow-ups on file.  Payor: [employer]HC MEDICARE ADVANTAGE PLAN / Plan: UHC PPO AARP MEDICARE COMPLETE CHOICE / Product Type: PPO /   This note is partially written by Greig Pouch, in the presence of and acting as the scribe of Dr. Arthea Farrow   I have reviewed, edited and added to  the note as needed to reflect  my best personal medical judgment.    Dr. Arthea Farrow, MD [facility] [place], [place] Ph:  [phone] Fax:  [phone]

## 2023-01-31 IMAGING — CT CT ANKLE*R* W/O CM
1 series · 12 of 14 positions shown, 15 images · non-contrast
Comparison: CT 05/22/2021

CLINICAL DATA: Right ankle pain

EXAM:
CT OF THE RIGHT ANKLE WITHOUT CONTRAST
TECHNIQUE: Multidetector CT imaging of the right ankle was performed according
to the standard protocol. Multiplanar CT image reconstructions were
also generated.

[Series 6: soft tissue lower extremity · axial · 0.33mm/px · z∈[-1005,-785]mm · 12 of 132 slices shown, 15 images]
[im 11/132  soft-tissue]
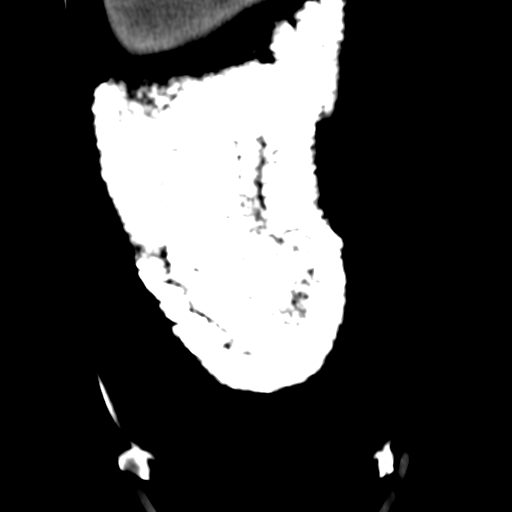
[im 11/132  bone]
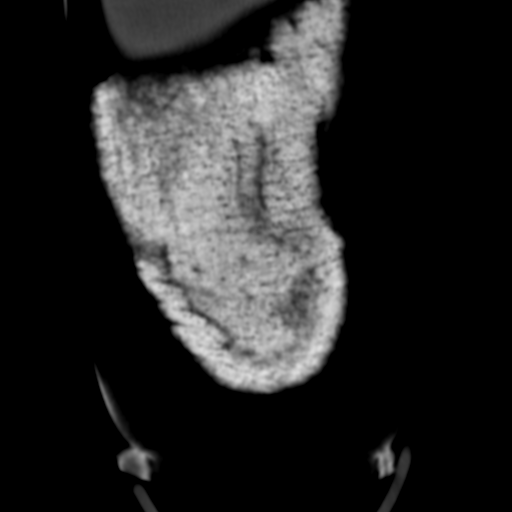
[im 21/132  bone]
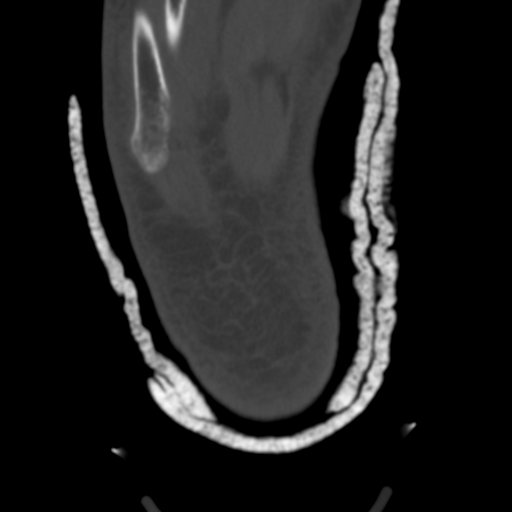
[im 31/132  bone]
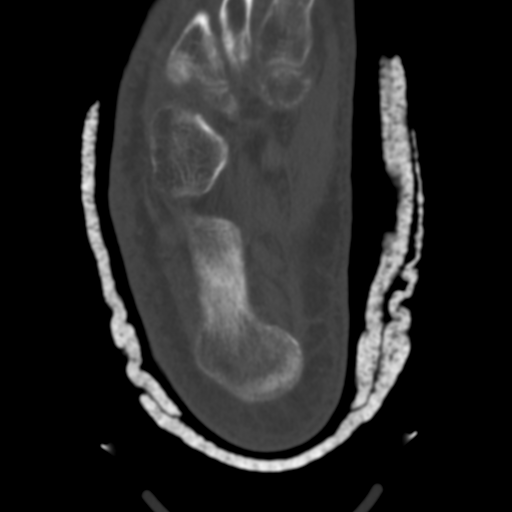
[im 41/132  bone]
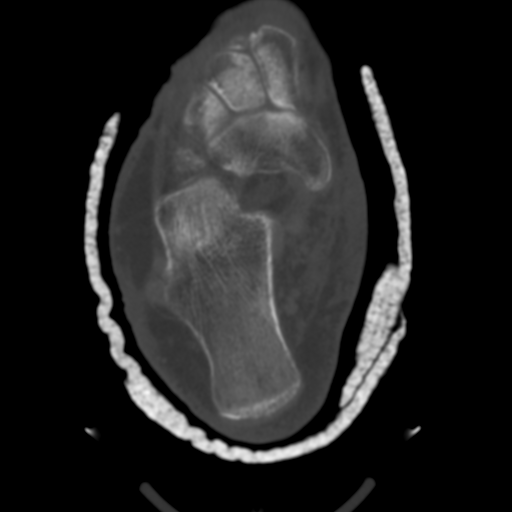
[im 51/132  soft-tissue]
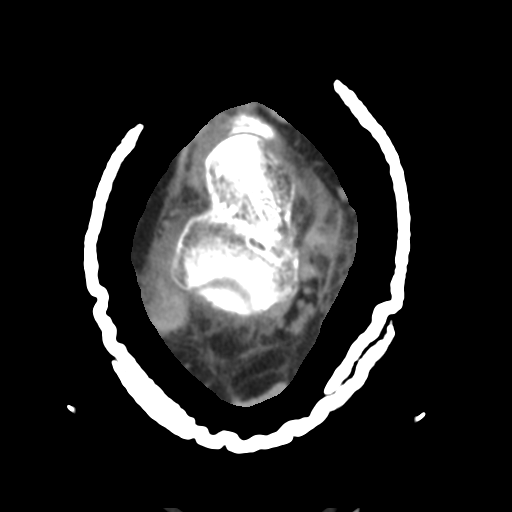
[im 51/132  bone]
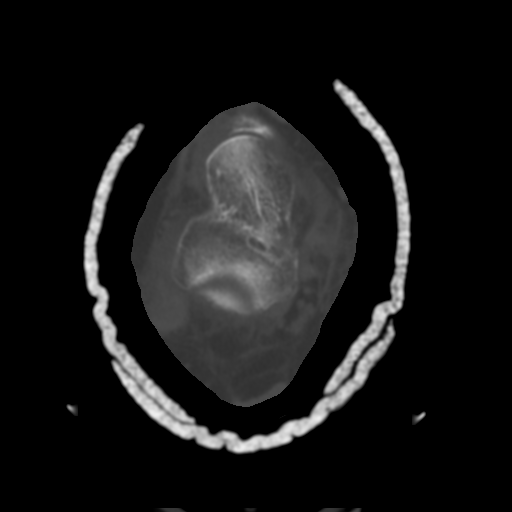
[im 61/132  bone]
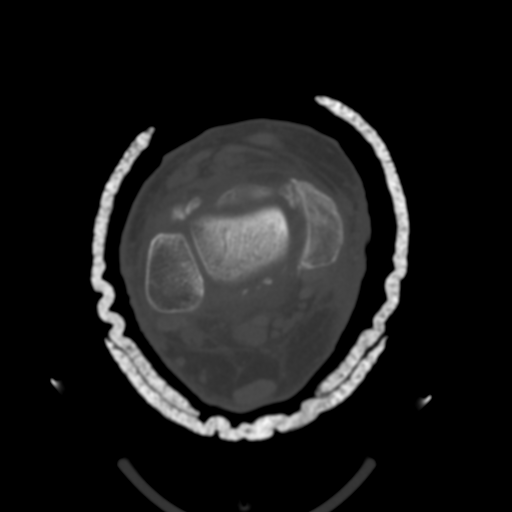
[im 71/132  bone]
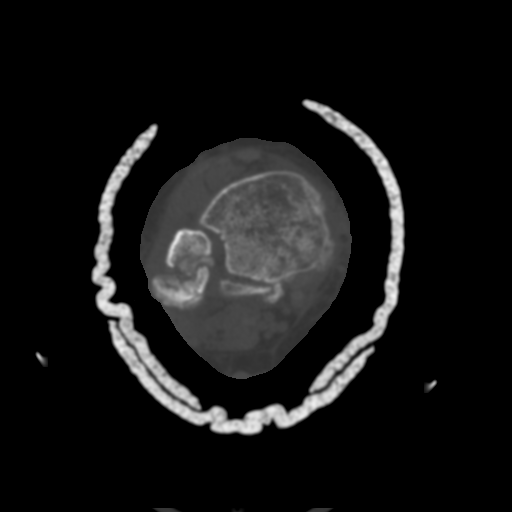
[im 81/132  bone]
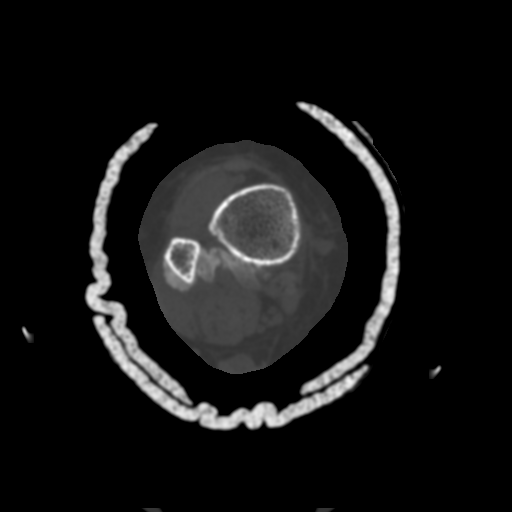
[im 91/132  soft-tissue]
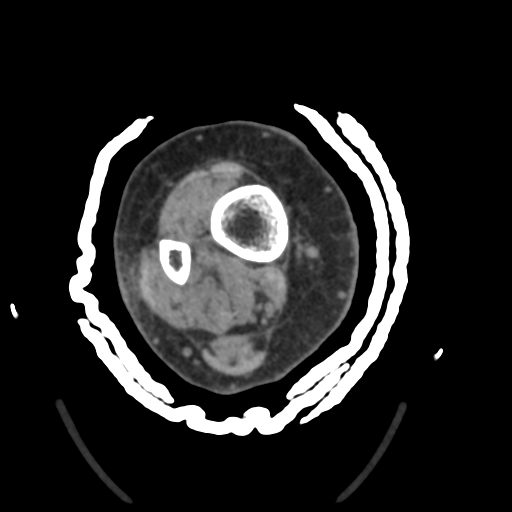
[im 91/132  bone]
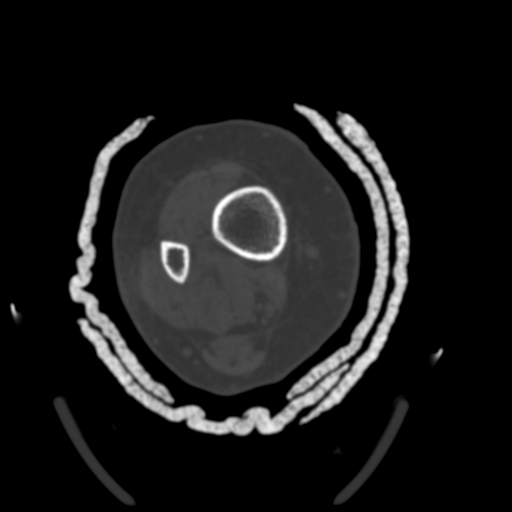
[im 101/132  bone]
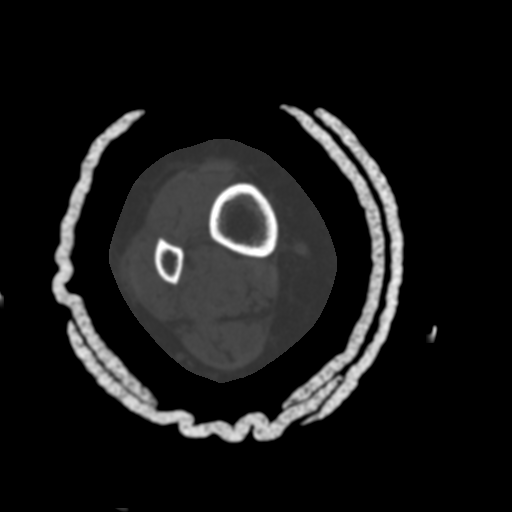
[im 111/132  bone]
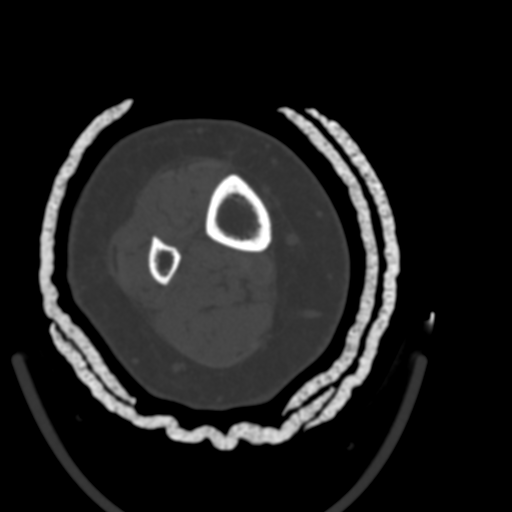
[im 121/132  bone]
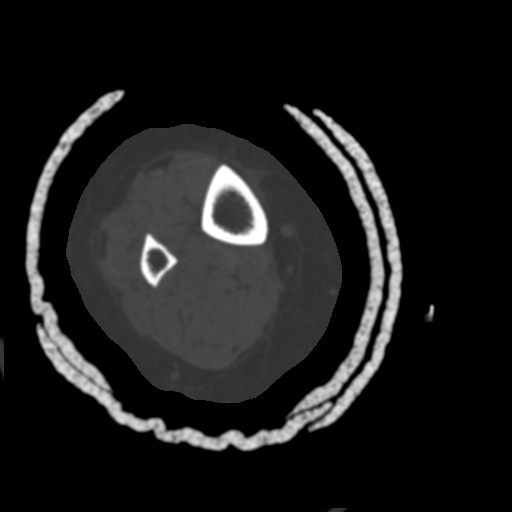

[12 of 14 positions shown; findings below may reference images not displayed]

FINDINGS: Bones/Joint/Cartilage

There is a trimalleolar ankle fracture as seen on prior CT.
Comminuted medial malleolar fracture including an obliquely oriented
fracture through the base of the malleolus and transverse fracture
through the midportion, with developing callus formation but
persistent fracture line. Oblique distal fibular fracture with
persistent mild posterolateral displacement and developing callus
formation but persistent fracture line. Posterior malleolar fracture
with mild posterior displacement and developing callus but no bony
bridging. The posterior malleolar fracture involves approximately
12% of the articular surface. Persistent lateral subluxation of the
talus with respect to the tibia.

Healing nondisplaced medial cuneiform and third metatarsal base
fractures in unchanged alignment normal tarsometatarsal joint
alignment.

Disuse osteopenia.  No significant ankle arthritis.

Ligaments

Suboptimally assessed by CT.

Muscles and Tendons

No acute tendon abnormality on CT.  No tendon entrapment.

Soft tissues

Persistent but decreased ankle soft tissue swelling.
IMPRESSION: Subacute trimalleolar ankle fracture as described above.

Healing nondisplaced fractures of the medial cuneiform and third
metatarsal base in unchanged alignment.

Decreased ankle soft tissue swelling in comparison to prior exam in
[DATE].

## 2023-02-14 IMAGING — RF DG ANKLE COMPLETE 3+V*R*
1 series · 6 of 6 positions shown · non-contrast
Comparison: None.

CLINICAL DATA: ORIF

EXAM:
RIGHT ANKLE - COMPLETE 3+ VIEW

[Series 1: run · 6 of 6 slices shown]
[im 1/6]
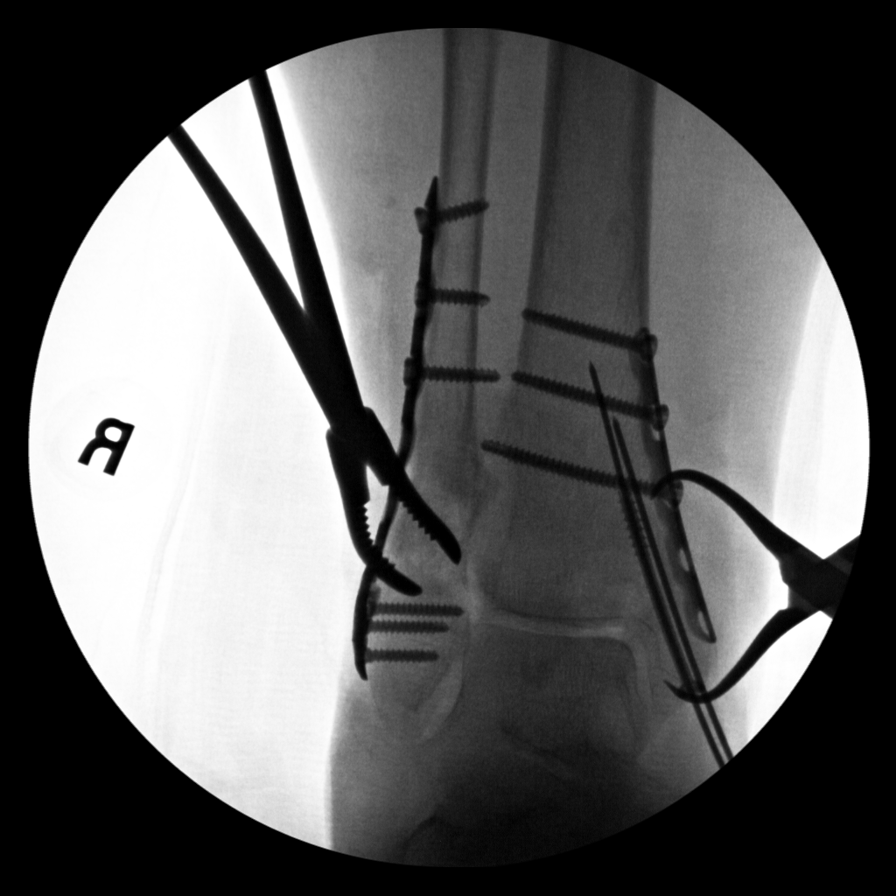
[im 2/6]
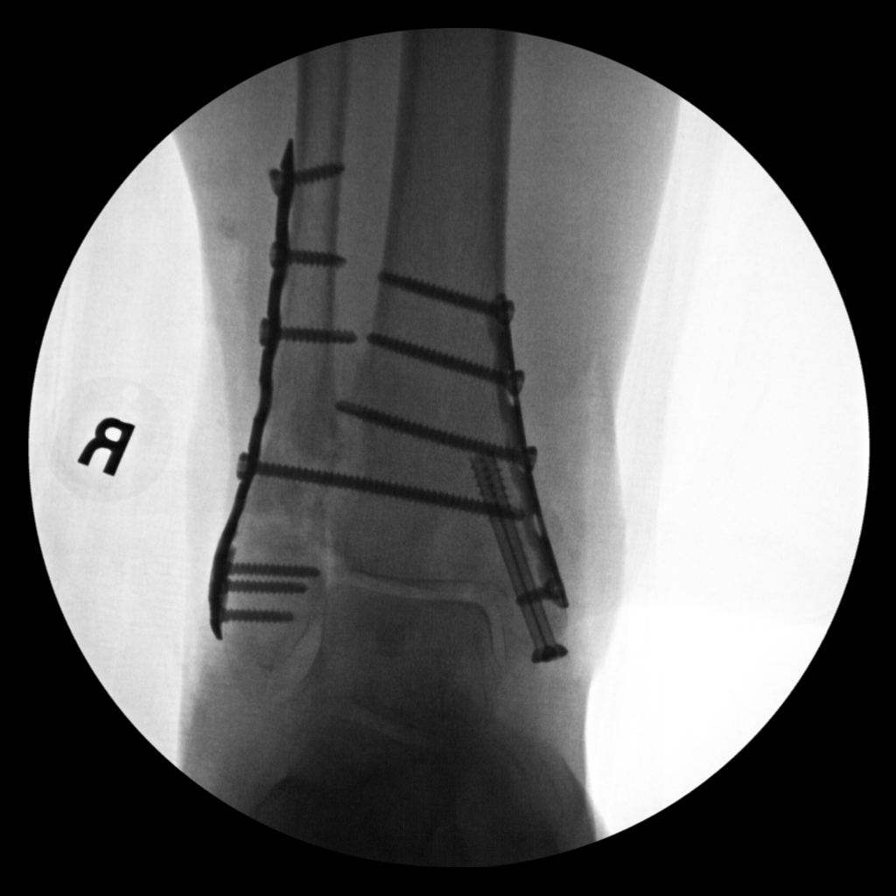
[im 3/6]
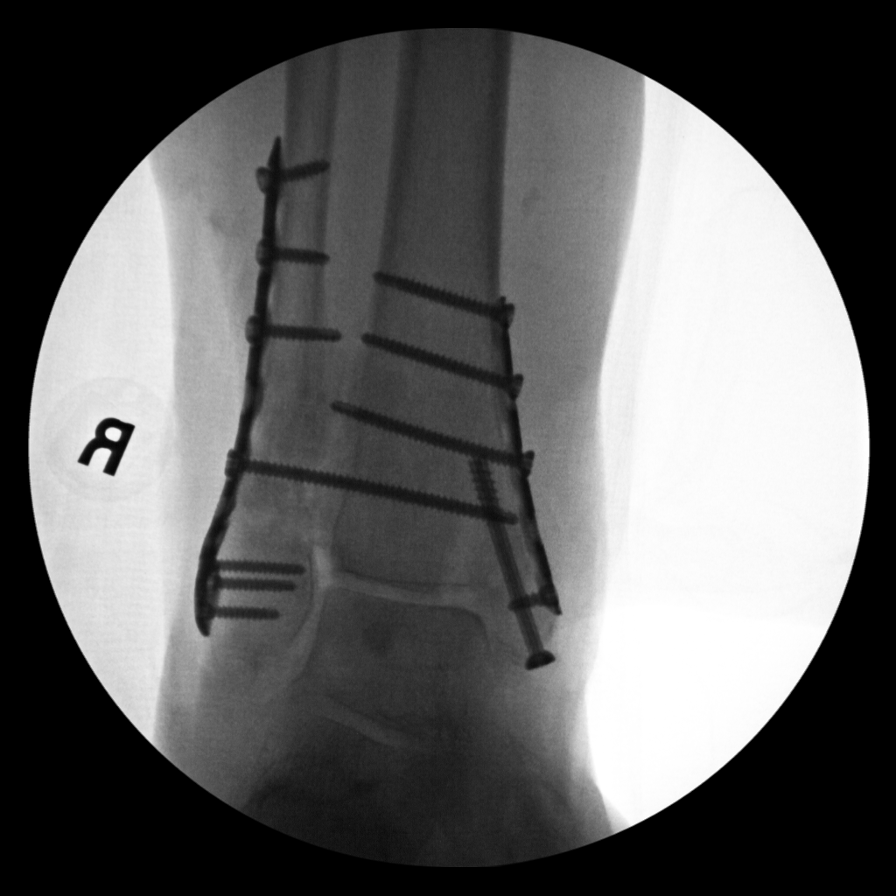
[im 4/6]
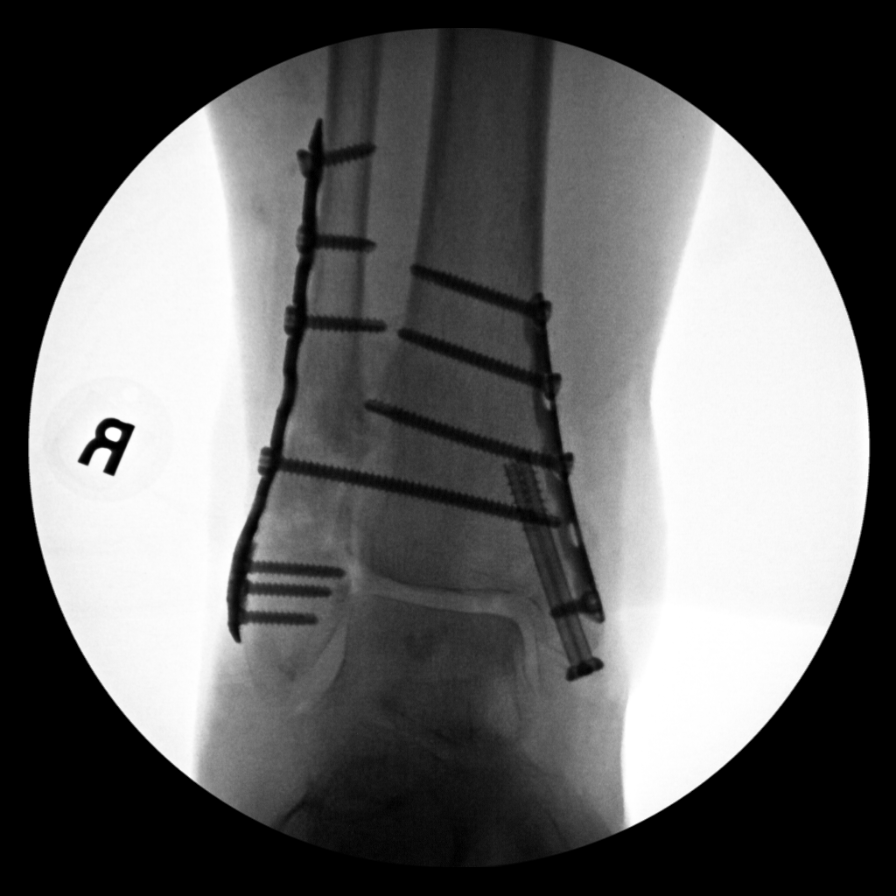
[im 5/6]
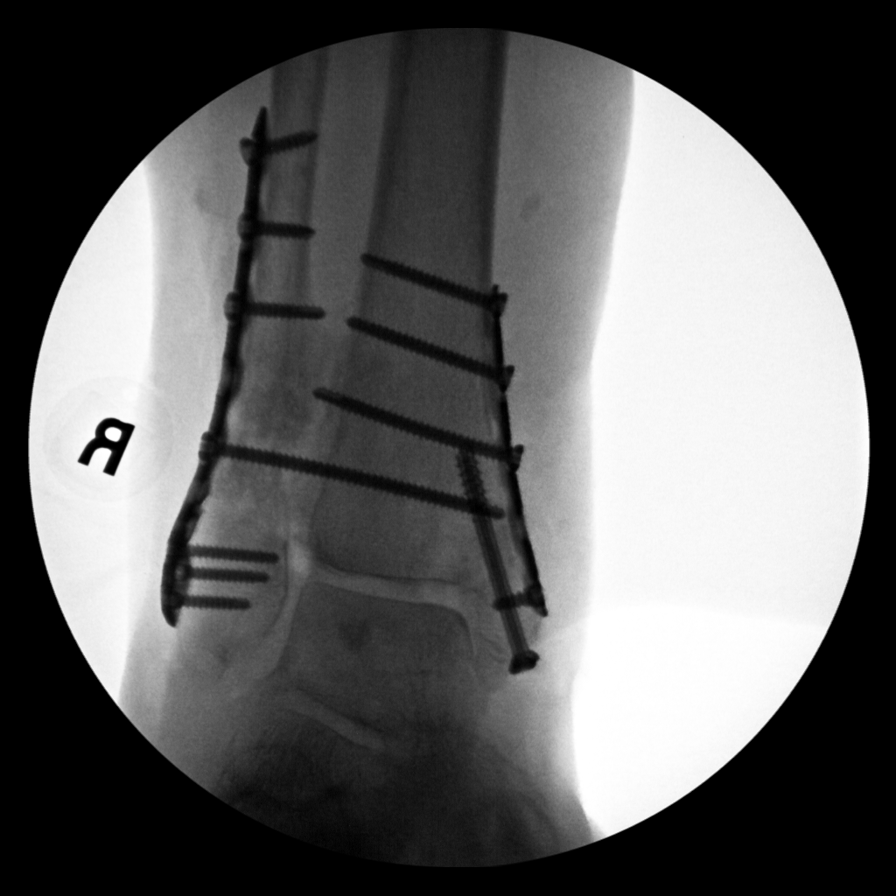
[im 6/6]
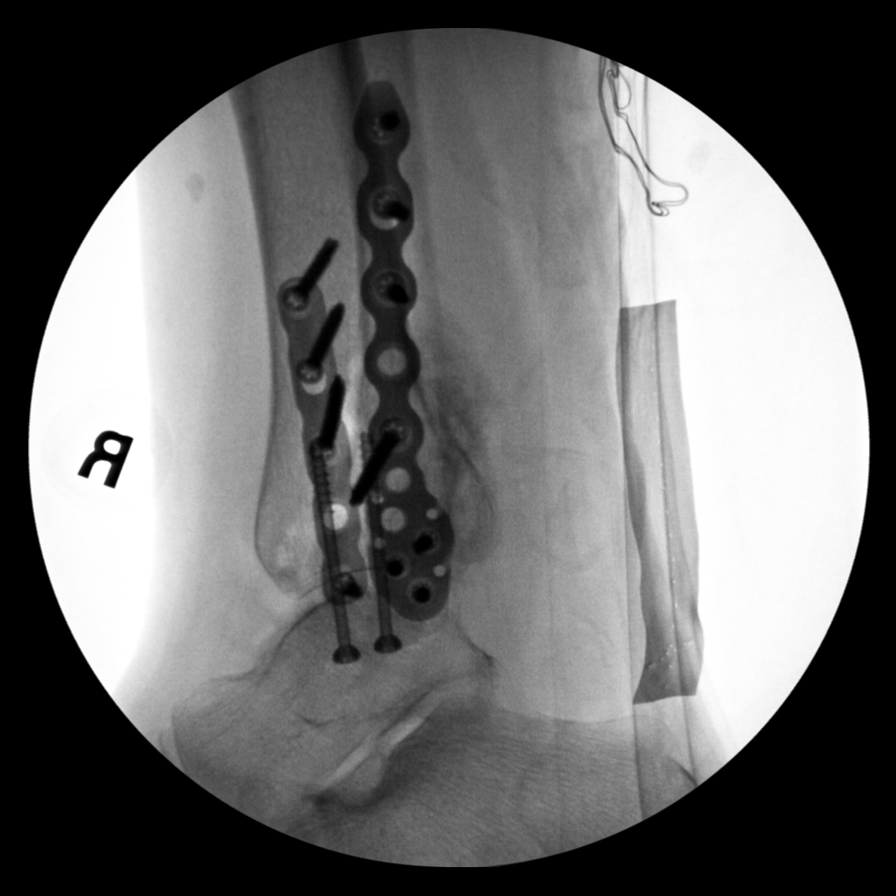

[6 of 6 positions shown; findings below may reference images not displayed]

FINDINGS: Intraoperative images demonstrate plate and screw fixation of the
distal fibula and tibia. One of the screws traverses the
syndesmosis. There are 2 screws traversing the medial malleolus.
There is improved alignment.
IMPRESSION: Fluoroscopic guidance for ankle fracture ORIF.

## 2023-06-25 NOTE — Progress Notes (Signed)
 Today the history is gathered from: 100% - patient  0% - alone  REFERRING PHYSICIAN: Corlis Honor BROCKS, MD PRIMARY CARE PHYSICIAN:  Corlis Honor BROCKS, MD    IMPRESSION/PLAN  Melissa Hunter is a 67 y.o.  female presenting for evaluation of  MULTIPLE SCLEROSIS / FATIGUE/ SLEEP DISORDER/ ANXIETY  - Stable. - Patient with no recent MS flare ups. Sleeping 5-6 hours nightly. Chronic fatigue. Taking Baclofen  10 mg BID, Zoloft  150 mg daily. No longer taking Trazodone 50 mg at night.  - Start taking Provigil 100 mg in the morning for chronic fatigue. - Stop taking Trazodone 50-100 mg at night. Do not refill. - Continue taking Baclofen  10 mg twice daily. Refill 90 day supply for 6 months. - Continue taking Zoloft  150 mg daily. Refill 90 day supply for 6 months.  DIFFICULTY WALKING/ FALLS - Ongoing.  - Patient with ongoing imbalance and gait difficulty. Ambulating with cane most of the time. Occasionally ambulating with walker. No longer completing PT.  - Referral to [facility] for gait and balance PT.  Follow up with Dr. Lane in 3-4 months.   CHIEF COMPLAIN & HISTORY OF PRESENT ILLNESS:  Melissa Hunter is a 67 y.o. female presenting for evaluation of: Chief Complaint  Patient presents with  . Multiple Sclerosis  . Fatigue  . Insomnia  . Anxiety  . Difficulty Walking  . Fall    MULTIPLE SCLEROSIS / FATIGUE/ SLEEP DISORDER/ ANXIETY / STRESS Patient presenting with no recent MS flare ups or symptoms. Recent stress, denies anxiety or depression. Sleeping 5-6 hours nightly. Chronic fatigue. Denies difficulty falling or staying asleep. Taking Baclofen  10 mg BID, Zoloft  150 mg daily. No longer taking Trazodone 50 mg at night.  DIFFICULTY WALKING/ FALLS Patient presenting with ongoing imbalance and gait difficulty. States difficulty only when walking on uneven surfaces. No recent falls. Ambulating with cane most of the time. Occasionally ambulating with walker. Endorses no longer completing PT.  Has not  started aquatic therapy.   DATA SUMMARIES: 03/10/22 MRI CERVICAL SPINE W WO CONTRAST IMPRESSION:  Stable cord lesions reflecting chronic demyelination. No abnormal  enhancement.  Multilevel degenerative changes as detailed above. Increased disc  herniation at C4-C5 with greater flattening of the left ventral  cord.   03/10/2022 MRI BRAIN W WO CONTRAST IMPRESSION:  New focus of T2 hyperintensity in the right frontal white matter  reflecting gliosis or demyelination. No abnormal enhancement.  Otherwise stable burden of demyelinating disease.   04/26/2020  MRI CERVICAL SPINE W, WITHOUT  IMPRESSION:  Multiple lesions in the cervical spinal cord compatible with  multiple sclerosis. No new or enhancing lesions.  Extensive cervical spondylosis causing multilevel foraminal  encroachment. Left-sided disc protrusion at C4-5 has improved in the  interval.   MRI BRAIN WITH AND WITHOUT 04/26/2020 IMPRESSION:  Multiple lesions in the cervical spinal cord compatible with  multiple sclerosis. No new or enhancing lesions.   Extensive cervical spondylosis causing multilevel foraminal  encroachment. Left-sided disc protrusion at C4-5 has improved in the  interval.    VISIT SUMMARIES:  07/10/2020: Patient with history of MS, fatigue, difficulty walking, and sleep disorder, ongoing. Increase Zoloft  to 100 mg nightly. Continue Avonex injections, will call in 6 months of refills. Continue baclofen  10 mg twice a day, refilled.  04/03/2020: - Stable. Patient with long history of MS.  Taking avonex injections weekly with ongoing fatigue.Continue Avonex injections for now. Will repeat imaging, if no new changes will discontinue avonex.Order brain and cervical MRI with and without  contrast to evaluate if any progression in MelissaCheck CBC, CMP, and Vit D.Continue baclofen  10 mg twice a day, refilled.  07/21/2017: Continue Avonex injection, check CBC, CMP, Vitd D.  Medications previously tried (reason  stopped): Tecfidera (increased WBC count)  MEDICATIONS Current Outpatient Medications  Medication Sig Dispense Refill  . atorvastatin  (LIPITOR) 10 MG tablet Take 20 mg by mouth once daily    . baclofen  (LIORESAL ) 10 MG tablet TAKE 1 TABLET BY MOUTH TWICE  DAILY 60 tablet 0  . cholecalciferol  (VITAMIN D3) 2,000 unit capsule Take 2,000 Units by mouth once daily.    . levothyroxine  (SYNTHROID ) 150 MCG tablet Take 150 mcg by mouth once daily Take on an empty stomach with a glass of water at least 30-60 minutes before breakfast.    . mirabegron (MYRBETRIQ) 25 mg ER Tablet Take 25 mg by mouth once daily    . ramipril  (ALTACE ) 10 MG capsule Take 10 mg by mouth 2 (two) times daily    . sertraline  (ZOLOFT ) 100 MG tablet Take 1.5 tablets (150 mg total) by mouth once daily (Patient taking differently: Take 100 mg by mouth once daily) 45 tablet 2  . AVONEX 30 mcg/0.5 mL PnKt INJECT 30MCG INTRAMUSCULARLY ONCE A WEEK (Patient not taking: Reported on 06/25/2023) 1 kit 0  . AVONEX 30 mcg/0.5 mL PnKt INJECT 30MCG INTRAMUSCULARLY ONCE A WEEK (Patient not taking: Reported on 06/25/2023) 1 kit 0  . AVONEX 30 mcg/0.5 mL PnKt INJECT 30MCG INTRAMUSCULARLY ONCE A WEEK (Patient not taking: Reported on 06/25/2023) 1 kit 0  . AVONEX 30 mcg/0.5 mL PnKt INJECT 30MCG  INTRAMUSCULARLY EVERY WEEK (Patient not taking: Reported on 06/25/2023) 1 kit 0  . AVONEX 30 mcg/0.5 mL PnKt INJECT 30MCG  INTRAMUSCULARLY EVERY WEEK (Patient not taking: Reported on 06/25/2023) 1 each 5  . AVONEX 30 mcg/0.5 mL PnKt INJECT 30MCG  INTRAMUSCULARLY WEEKLY (Patient not taking: Reported on 06/25/2023) 1 each 5  . AVONEX 30 mcg/0.5 mL PnKt INJECT 30MCG  INTRAMUSCULARLY WEEKLY (Patient not taking: Reported on 06/25/2023) 1 each 0  . interferon beta-1a (AVONEX) 30 mcg/0.5 mL PnKt Inject 30 mcg into the muscle once a week (Patient not taking: Reported on 06/25/2023) 1 kit 3  . interferon beta-1a, albumin, (AVONEX) 30 mcg injection Inject 30 mcg into the  muscle every 7 (seven) days. (Patient not taking: Reported on 06/25/2023)    . levothyroxine  (SYNTHROID ) 125 MCG tablet Take 125 mcg by mouth once daily. Take on an empty stomach with a glass of water at least 30-60 minutes before breakfast. (Patient not taking: Reported on 06/25/2023)    . melatonin 10 mg Tab Take 10 mg by mouth nightly. (Patient not taking: Reported on 06/25/2023)    . meloxicam (MOBIC) 7.5 MG tablet TAKE ONE TABLET BY MOUTH TWICE DAILY AFTER MEALS (Patient not taking: Reported on 06/25/2023) 60 tablet 0  . methen-m.blue-s.phos-phsal-hyo 118-10-40.8-36 mg Cap Take by mouth. (Patient not taking: Reported on 06/25/2023)    . modafiniL (PROVIGIL) 100 MG tablet Take 1 tablet (100 mg total) by mouth once daily as needed (fatigue) (Patient not taking: Reported on 06/25/2023) 30 tablet 2  . onabotulinumtoxin A (BOTOX ) 10 units/mL injection by XX route once. (Patient not taking: Reported on 06/25/2023)    . pantoprazole (PROTONIX) 40 MG DR tablet Take 40 mg by mouth as needed. Reported on 10/09/2015  (Patient not taking: Reported on 06/25/2023)    . sertraline  (ZOLOFT ) 50 MG tablet Take 50 mg by mouth once daily. (Patient not taking: Reported  on 06/25/2023)    . traZODone (DESYREL) 50 MG tablet Take 50- 100 mg at night (Patient not taking: Reported on 06/25/2023) 60 tablet 3   No current facility-administered medications for this visit.    ALLERGIES No Known Allergies   EXAM   Vitals:   06/25/23 1333  Weight: (!) 116.1 kg (256 lb)  Height: 172.7 cm (5' 8)  PainSc: 0-No pain      Body mass index is 38.92 kg/m.  The baseline comprehensive neurological exam obtained by Rosina Dys, NP on 04/03/2020 is included below.  Significant changes from today's visit are shown in bold.  GENERAL: Pleasant female, NAD. Normocephalic and atraumatic.  MUSCULOSKELETAL: Bulk - Obese Tone - Normal Pronator Drift - Absent bilaterally. Ambulation - Gait and station are mild to  moderately unsteady   NEUROLOGICAL: MENTAL STATUS: Patient is oriented to person, place and time.   Recent memory is intact.   Remote memory is intact.   Attention span and concentration are intact.   Naming and repetition are intact. Comprehension is intact.   Expressive speech is intact.   Patient's fund of knowledge is normal for educational level.  CRANIAL NERVES: Visual acuity and visual fields are intact         Extraocular muscles are intact                        Facial sensation is intact bilaterally                Facial strength is intact bilaterally                   Hearing is intact bilaterally                              Palate elevates midline, normal phonation      COORDINATION/CEREBELLAR: Finger to nose testing is not tested today      PAST MEDICAL HISTORY Past Medical History:  Diagnosis Date  . Difficulty walking 12/22/2013  . Fatigue 12/22/2013  . MS (multiple sclerosis) (CMS/HHS-HCC) 12/22/2013  . Multiple sclerosis (CMS/HHS-HCC)   . Obesity 12/22/2013  . Sleep disorder 12/22/2013    PAST SURGICAL HISTORY Past Surgical History:  Procedure Laterality Date  . STAPEDECTOMY W/REESTABLISHMENT      FAMILY HISTORY Family History  Problem Relation Name Age of Onset  . Heart disease Mother    . Kidney failure Father      SOCIAL HISTORY  Social History   Tobacco Use  . Smoking status: Every Day    Current packs/day: 0.00    Average packs/day: 0.8 packs/day for 20.0 years (15.0 ttl pk-yrs)    Types: Cigarettes    Start date: 02/24/2002    Last attempt to quit: 02/24/2022    Years since quitting: 1.3    Passive exposure: Current  . Smokeless tobacco: Never  Vaping Use  . Vaping status: Never Used  Substance Use Topics  . Alcohol use: Defer    Comment: occasionally, once a month  . Drug use: No    Types: Marijuana    Comment: occasional     REVIEW OF SYSTEMS:  More than 10 system ROS form was given to the patient to complete and I have  reviewed it.  The form was sent for scan to the patient's EHR.  Pertinent positives are mentioned above in the HPI.   DATA  I have personally reviewed  all of the data outlined below both prior to the appointment and during the appointment with the patient as appropriate. ___________________________________________________________  IMAGING: 06/14/2016  Multiple sclerosis. Abnormal grip strength and balance. Urinary incontinence recently.  EXAM: MRI CERVICAL SPINE WITHOUT CONTRAST  TECHNIQUE: Multiplanar, multisequence MR imaging of the cervical spine was performed. No intravenous contrast was administered.  COMPARISON:  None.  FINDINGS: Alignment: Normal  Vertebrae: No fracture or primary bone lesion.  Cord: Multiple foci of abnormal cord T2 signal consistent with multiple sclerosis involvement. This is present dorsally at C2-3, ventrally at C4 are, dorsally at C6 and dorsally at C7-T1.  Posterior Fossa, vertebral arteries, paraspinal tissues: As described on the brain study.  Disc levels:  C3-4:  Uncovertebral osteophytes cause bilateral foraminal stenosis.  C4-5: Left posterior lateral osteophyte in disc herniation indents the left side of the cord and encroaches upon the intervertebral foramen on the left.  C5-6: Spondylosis more pronounced towards the right with foraminal narrowing right more than left.  C6-7:  Disc bulge.  Mild foraminal narrowing right more than left.  C7-T1:  Normal interspace.  IMPRESSION: Cord lesions consistent with multiple sclerosis involvement as described above. The cord is not swollen. These are presumed chronic, but I do not have any comparison study.  C3-4: Bilateral uncovertebral osteophytes that cause foraminal encroachment could affect either C4 nerve root.  C4-5: Left posterior lateral osteophyte in disc herniation could compress the left C5 nerve root.  C5-6: Mild foraminal narrowing right more than left without  definite neural compression.  C6-7: Mild foraminal narrowing right more than left without definite neural compression.   Chronic diagnosis of multiple sclerosis. Worsening grip strength and balance disturbance. Urinary incontinence. ___________________________________________________________  EXAM: MRI HEAD WITHOUT CONTRAST  TECHNIQUE: Multiplanar, multiecho pulse sequences of the brain and surrounding structures were obtained without intravenous contrast.  COMPARISON:  11/12/2012  FINDINGS: Brain: Diffusion imaging does not show any acute or subacute infarction or true restricted diffusion. The brainstem and cerebellum are normal. Pattern of abnormal white matter lesions throughout the deep and subcortical white matter in the corpus callosum is precisely stable. No new, enlarging or changed lesions. No cortical insult. No mass lesion, hemorrhage or hydrocephalus.  Vascular: Major vessels at the base of the brain show flow.  Skull and upper cervical spine: Negative  Sinuses/Orbits: Clear  Other: Negative  IMPRESSION: Precisely stable since the study of 11/12/2012. Multiple lesions of the cerebral hemispheric white matter and corpus callosum, unchanged in size and signal characteristics. No new lesions. Findings consistent with stable demyelinating disease. ___________________________________________________________  MRI BRAIN  11/12/12 IMPRESSION: Progressive changes of MS.   ___________________________________________________________   No visits with results within 6 Month(s) from this visit.  Latest known visit with results is:  Office Visit on 04/03/2020  Component Date Value Ref Range Status  . WBC (White Blood Cell Count) 04/03/2020 6.3  4.1 - 10.2 10^3/uL Final  . RBC (Red Blood Cell Count) 04/03/2020 4.91  4.04 - 5.48 10^6/uL Final  . Hemoglobin 04/03/2020 14.3  12.0 - 15.0 gm/dL Final  . Hematocrit 91/68/7978 45.3  35.0 - 47.0 % Final  . MCV (Mean  Corpuscular Volume) 04/03/2020 92.3  80.0 - 100.0 fl Final  . MCH (Mean Corpuscular Hemoglobin) 04/03/2020 29.1  27.0 - 31.2 pg Final  . MCHC (Mean Corpuscular Hemoglobin * 04/03/2020 31.6 (L)  32.0 - 36.0 gm/dL Final  . Platelet Count 04/03/2020 194  150 - 450 10^3/uL Final  . RDW-CV (Red Cell Distribution Widt* 04/03/2020 13.1  11.6 - 14.8 % Final  . MPV (Mean Platelet Volume) 04/03/2020 10.1  9.4 - 12.4 fl Final  . Neutrophils 04/03/2020 4.83  1.50 - 7.80 10^3/uL Final  . Lymphocytes 04/03/2020 0.89 (L)  1.00 - 3.60 10^3/uL Final  . Monocytes 04/03/2020 0.45  0.00 - 1.50 10^3/uL Final  . Eosinophils 04/03/2020 0.12  0.00 - 0.55 10^3/uL Final  . Basophils 04/03/2020 0.03  0.00 - 0.09 10^3/uL Final  . Neutrophil % 04/03/2020 76.2 (H)  32.0 - 70.0 % Final  . Lymphocyte % 04/03/2020 14.1  10.0 - 50.0 % Final  . Monocyte % 04/03/2020 7.1  4.0 - 13.0 % Final  . Eosinophil % 04/03/2020 1.9  1.0 - 5.0 % Final  . Basophil% 04/03/2020 0.5  0.0 - 2.0 % Final  . Immature Granulocyte % 04/03/2020 0.2  <=0.7 % Final  . Immature Granulocyte Count 04/03/2020 0.01  <=0.06 10^3/L Final  . Glucose 04/03/2020 105  70 - 110 mg/dL Final  . Sodium 91/68/7978 140  136 - 145 mmol/L Final  . Potassium 04/03/2020 4.4  3.6 - 5.1 mmol/L Final  . Chloride 04/03/2020 105  97 - 109 mmol/L Final  . Carbon Dioxide (CO2) 04/03/2020 29.6  22.0 - 32.0 mmol/L Final  . Urea Nitrogen (BUN) 04/03/2020 14  7 - 25 mg/dL Final  . Creatinine 91/68/7978 1.1  0.6 - 1.1 mg/dL Final  . Glomerular Filtration Rate (eGFR) 04/03/2020 50 (L)  >60 mL/min/1.73sq m Final  . Calcium  04/03/2020 9.8  8.7 - 10.3 mg/dL Final  . AST  91/68/7978 11  8 - 39 U/L Final  . ALT  04/03/2020 9  5 - 38 U/L Final  . Alk Phos (alkaline Phosphatase) 04/03/2020 55  34 - 104 U/L Final  . Albumin 04/03/2020 4.3  3.5 - 4.8 g/dL Final  . Bilirubin, Total 04/03/2020 0.9  0.3 - 1.2 mg/dL Final  . Protein, Total 04/03/2020 6.2  6.1 - 7.9 g/dL Final  . A/G Ratio  91/68/7978 2.3  1.0 - 5.0 gm/dL Final  . Vitamin D , 25-Hydroxy - [facility] 04/03/2020 65.5  30.0 - 100.0 ng/mL Final   No follow-ups on file.  Payor: UHC MEDICARE ADVANTAGE PLAN / Plan: UHC PPO AARP MEDICARE COMPLETE CHOICE / Product Type: PPO /   This note is partially written by [employer], in the presence of and acting as the scribe of Dr. Arthea Farrow.   I have reviewed, edited and added to the note as needed to reflect my best personal medical judgment.    Dr. Arthea Farrow, MD [facility] [place], [place] Ph:  [phone] Fax:  [phone]  This video encounter was conducted with the patient's (or proxy's) verbal consent via secure, interactive audio and video telecommunications while in clinic/office/hospital.  The patient (or proxy) was instructed to have this encounter in a suitably private space and to only have persons present to whom they give permission to participate. In addition, patient identity was confirmed by use of name plus an additional identifier.  This visit was coded based on medical decision making (MDM).

## 2024-02-24 ENCOUNTER — Emergency Department
Admission: EM | Admit: 2024-02-24 | Discharge: 2024-02-24 | Disposition: A | Discharge status: Home / Self Care | Attending: Emergency Medicine | Admitting: Emergency Medicine

## 2024-02-24 ENCOUNTER — Emergency Department

## 2024-02-24 DIAGNOSIS — M79631 Pain in right forearm: Secondary | ICD-10-CM | POA: Insufficient documentation

## 2024-02-24 DIAGNOSIS — W010XXA Fall on same level from slipping, tripping and stumbling without subsequent striking against object, initial encounter: Secondary | ICD-10-CM | POA: Insufficient documentation

## 2024-02-24 DIAGNOSIS — S0990XA Unspecified injury of head, initial encounter: Secondary | ICD-10-CM | POA: Diagnosis present

## 2024-02-24 DIAGNOSIS — M25421 Effusion, right elbow: Secondary | ICD-10-CM | POA: Diagnosis not present

## 2024-02-24 DIAGNOSIS — W19XXXA Unspecified fall, initial encounter: Secondary | ICD-10-CM

## 2024-02-24 DIAGNOSIS — Y990 Civilian activity done for income or pay: Secondary | ICD-10-CM | POA: Insufficient documentation

## 2024-02-24 MED ORDER — OXYCODONE HCL 5 MG PO TABS: 5.0000 mg | ORAL_TABLET | Freq: Four times a day (QID) | ORAL | 0 refills | Status: AC | PRN

## 2024-02-24 MED ORDER — LIDOCAINE 5 % EX PTCH
1.0000 | MEDICATED_PATCH | Freq: Once | CUTANEOUS | Status: DC
  Administered 2024-02-24: 1 via TRANSDERMAL
  Filled 2024-02-24: qty 1

## 2024-02-24 MED ORDER — IBUPROFEN 600 MG PO TABS: 600.0000 mg | ORAL_TABLET | Freq: Three times a day (TID) | ORAL | 0 refills | Status: AC | PRN

## 2024-02-24 MED ORDER — KETOROLAC TROMETHAMINE 30 MG/ML IJ SOLN
30.0000 mg | Freq: Once | INTRAMUSCULAR | Status: AC
  Administered 2024-02-24: 30 mg via INTRAMUSCULAR
  Filled 2024-02-24: qty 1

## 2024-02-24 NOTE — ED Provider Notes (Signed)
 [facility] Provider Note    Event Date/Time   First MD Initiated Contact with Patient 02/24/24 0957     (approximate)   History   Fall, Head Injury, and Arm Injury   HPI  Melissa Hunter is a 68 y.o. female with history of MS who comes in for a fall.  Patient reports she had a mechanical fall which she tripped.  She states that she was just really hot and tired and stressed with work.  She typically walks with a cane.  She has got a history of issues with walking previously from her history of MS.  She denies any recent MS flares denies any weakness, numbness.  I reviewed a note from neurology, Dr. Lane who has a history of difficulty walking, falls.  She denies any syncope, weakness, chest pain, shortness of breath, new leg swelling or any other concerns.   Physical Exam   Triage Vital Signs: ED Triage Vitals  Encounter Vitals Group     BP 02/24/24 0914 (!) 143/78     Girls Systolic BP Percentile --      Girls Diastolic BP Percentile --      Boys Systolic BP Percentile --      Boys Diastolic BP Percentile --      Pulse Rate 02/24/24 0914 80     Resp 02/24/24 0914 17     Temp 02/24/24 0914 98.6 F (37 C)     Temp Source 02/24/24 0914 Oral     SpO2 02/24/24 0914 99 %     Weight 02/24/24 0915 250 lb (113.4 kg)     Height 02/24/24 0915 5' 8 (1.727 m)     Head Circumference --      Peak Flow --      Pain Score 02/24/24 0926 3     Pain Loc --      Pain Education --      Exclude from Growth Chart --     Most recent vital signs: Vitals:   02/24/24 0914  BP: (!) 143/78  Pulse: 80  Resp: 17  Temp: 98.6 F (37 C)  SpO2: 99%     General: Awake, no distress.  CV:  Good peripheral perfusion.  Resp:  Normal effort.  Abd:  No distention.  Other:  Good distal pulse of her right hand.  Full range of motion, sensation intact of the hand.  She reports pain in the right forearm to right mid humerus.  She reports pain worsening with movement no  C-spine tenderness.  Equal strength in arms, sensation intact.  Equal strength in legs.  Sensation intact.  Full range of motion of neck. Soft and nontender abdomen.  ED Results / Procedures / Treatments   Labs (all labs ordered are listed, but only abnormal results are displayed) Labs Reviewed - No data to display   RADIOLOGY I have reviewed the xray personally and interpreted no evidence of fracture   PROCEDURES:  Critical Care performed: No  Procedures   MEDICATIONS ORDERED IN ED: Medications  lidocaine  (LIDODERM ) 5 % 1 patch (has no administration in time range)  ketorolac  (TORADOL ) 30 MG/ML injection 30 mg (has no administration in time range)     IMPRESSION / MDM / ASSESSMENT AND PLAN / ED COURSE  I reviewed the triage vital signs and the nursing notes.   Patient's presentation is most consistent with acute presentation with potential threat to life or bodily function.   Differential is intracranial hemorrhage given  she did report hitting her head but she denies any concerns and is Nexus low risk therefore holding off on CT imaging of the neck.  X-rays of the arm are ordered to evaluate for any fractures.    We discussed blood work for patient but she denies any lightheadedness, other symptoms and she reports that it was a mechanical fall in which she tripped.  She declined any blood work.  She reports having follow-up with her PCP on Friday and will follow-up with them for her routine blood work.  She had a CT scan head ordered due to hitting her head but she denied any neck pain.  She reported some pain on her right arm.  CT head is negative x-rays hand negative, forearm shows possible elbow joint effusion recommended an elbow projection.  X-ray of the shoulder negative  IMPRESSION: 1. Limited study due to difficulty with positioning. 2. Findings consistent with joint effusion. No acute fracture evident. Follow-up MRI may be warranted to evaluate for  occult fracture or soft tissue derangement. 3. Mild degenerative changes.  Given patient's discomfort with concern for joint effusion I do not see evidence of septic joint there is no warmth or redness she is neurovascularly intact.  Discussed with patient splinting and placing in a sling for outpatient follow-up.  We discussed pain management and she preferred using ibuprofen  states that she is typically on 800 but I recommended doing 600 and is less side effects.  We also discussed a short course of oxycodone  but only to take it out if needed she states that she will try to take it at nighttime she understands there is increased risk for falls with that.  She does use a cane to ambulate so I did discuss if she would be able to ambulate with using her cane on her left hand.  Offered patient admission to the hospital for consideration of placement, PT OT and patient declined stated that she would be able to ambulate with her cane.    FINAL CLINICAL IMPRESSION(S) / ED DIAGNOSES   Final diagnoses:  Fall, initial encounter  Injury of head, initial encounter  Effusion of right elbow     Rx / DC Orders   ED Discharge Orders          Ordered    ibuprofen  (ADVIL ) 600 MG tablet  Every 8 hours PRN        02/24/24 1121    oxyCODONE  (ROXICODONE ) 5 MG immediate release tablet  Every 6 hours PRN        02/24/24 1121             Note:  This document was prepared using Dragon voice recognition software and may include unintentional dictation errors.   Ernest Ronal BRAVO, MD 02/24/24 1124

## 2024-02-24 NOTE — ED Triage Notes (Signed)
 Pt c/o head pain and R arm pain following a fall last night.  Pain score 3/10 increasing w/ movement.  Pt reports she was tired, hot, and lost her balance.  Pt walks w/ a cane.  Denies LOC and blood thinners.    Hx of MS.

## 2024-02-24 NOTE — Discharge Instructions (Addendum)
 Call orthopedics to make a appointment to discuss further workup including possible MRI and return to the ER for worsening symptoms, any other concerns.  Try to use the oxycodone  only at nighttime because it can make you lightheaded or dizzy.  Start off with a half a pill and see how it helps.    IMPRESSION: 1. Limited study due to difficulty with positioning. 2. Findings consistent with joint effusion. No acute fracture evident. Follow-up MRI may be warranted to evaluate for occult fracture or soft tissue derangement. 3. Mild degenerative changes.  Take oxycodone  as prescribed. Do not drink alcohol, drive or participate in any other potentially dangerous activities while taking this medication as it may make you sleepy. Do not take this medication with any other sedating medications, either prescription or over-the-counter. If you were prescribed Percocet or Vicodin, do not take these with acetaminophen  (Tylenol ) as it is already contained within these medications.  This medication is an opiate (or narcotic) pain medication and can be habit forming. Use it as little as possible to achieve adequate pain control. Do not use or use it with extreme caution if you have a history of opiate abuse or dependence. If you are on a pain contract with your primary care doctor or a pain specialist, be sure to let them know you were prescribed this medication today from the Emergency Department. This medication is intended for your use only - do not give any to anyone else and keep it in a secure place where nobody else, especially children, have access to it.

## 2024-03-02 ENCOUNTER — Ambulatory Visit: Admitting: Orthopedic Surgery

## 2024-03-02 VITALS — BP 163/86 | Ht 67.0 in | Wt 269.0 lb

## 2024-03-02 DIAGNOSIS — M25521 Pain in right elbow: Secondary | ICD-10-CM | POA: Diagnosis not present

## 2024-03-03 ENCOUNTER — Encounter: Payer: Self-pay | Admitting: Orthopedic Surgery

## 2024-03-03 NOTE — Progress Notes (Signed)
 New Patient Visit  Assessment: Melissa Hunter is a 68 y.o. female with the following: 1. Pain in right elbow  Plan: SOFIJA ANTWI fell recently, and continues to have pain in the right arm.  Pain is localized to around the right elbow.  Radiographs emergency department are without obvious fracture.  There appear to be some swelling in the joint.  She was placed in the splint, but has stopped using the splint.  I provided reassurance.  Radiographs of the shoulder, elbow and forearm are all negative for acute fractures.  Anticipate gradual resolution.  She does use a cane, which is probably not helping her recover.  Medications as needed.  She will follow-up in clinic as needed.  Follow-up: Return if symptoms worsen or fail to improve.  Subjective:  Chief Complaint  Patient presents with   Arm Injury    Pain in the arm  she fell and had a splint on and she took it off because she said she is right handed and can not do anything     History of Present Illness: Melissa Hunter is a 68 y.o. female who presents for evaluation of right elbow pain.  She sustained a mechanical fall couple days ago.  She states that she tripped.  She landed on her right side.  She was evaluated emergency department.  Radiographs of were negative, although there was some concern for swelling within the right elbow, as noted on the x-rays.  She was placed in a splint.  Since, she has removed the splint as she is right-handed, and is difficult for her to do anything with her other hand.  She does walk with the assistance of a cane.  She has a history of MS, which has caused some issues over the years.  She is complaining of pain diffusely around the right elbow, extending to the forearm.  No numbness or tingling.   Review of Systems: No fevers or chills No numbness or tingling No chest pain No shortness of breath No bowel or bladder dysfunction No GI distress No headaches   Medical History:  Past Medical  History:  Diagnosis Date   Anxiety    Family history of adverse reaction to anesthesia    PTS BROTHER-AGITATED, COMBATIVE   GERD (gastroesophageal reflux disease)    H/O   Headache     MIGRAINES-RARE   Hypertension    Hypothyroidism    Neuromuscular disorder (HCC)    MS   Sleep apnea    has cpap but has not used in a long time.    Past Surgical History:  Procedure Laterality Date   BOTOX  INJECTION N/A 04/08/2016   Procedure: BOTOX  INJECTION;  Surgeon: Ozell JONELLE Burkes, MD;  Location: [facility];  Service: Urology;  Laterality: N/A;   BOTOX  INJECTION N/A 11/04/2016   Procedure: BOTOX  INJECTION;  Surgeon: Ozell JONELLE Burkes, MD;  Location: [facility];  Service: Urology;  Laterality: N/A;   CYSTOSCOPY N/A 04/08/2016   Procedure: CYSTOSCOPY;  Surgeon: Ozell JONELLE Burkes, MD;  Location: [facility];  Service: Urology;  Laterality: N/A;   CYSTOSCOPY N/A 11/04/2016   Procedure: CYSTOSCOPY;  Surgeon: Ozell JONELLE Burkes, MD;  Location: [facility];  Service: Urology;  Laterality: N/A;   ORIF ANKLE FRACTURE Right 07/23/2021   Procedure: OPEN REDUCTION OF ANKLE DISLOCATION, OPEN REDUCTION INTERNAL FIXATION OF SUBACUTE TRIMALLEOLAR ANKLE FRACTURE WITH POSTERIOR FIXATION, SYNDESMOSIS;  Surgeon: Elsa Lonni JONELLE, MD;  Location: [facility] OR;  Service: Orthopedics;  Laterality: Right;  STAPEDECTOMY      No family history on file. Social History   Tobacco Use   Smoking status: Some Days    Current packs/day: 0.25    Average packs/day: 0.3 packs/day for 25.0 years (6.3 ttl pk-yrs)    Types: Cigarettes   Smokeless tobacco: Never  Vaping Use   Vaping status: Never Used  Substance Use Topics   Alcohol use: Not Currently    Comment: 3 GLASSESS WINE/WEEK   Drug use: No    No Known Allergies  Current Meds  Medication Sig   atorvastatin  (LIPITOR) 20 MG tablet Take 20 mg by mouth at bedtime.   baclofen  (LIORESAL ) 10 MG tablet Take 10 mg by mouth 2 (two) times daily.   Cholecalciferol  (VITAMIN D ) 2000 units tablet  Take 2,000 Units by mouth daily.   ibuprofen  (ADVIL ) 600 MG tablet Take 1 tablet (600 mg total) by mouth every 8 (eight) hours as needed for up to 7 days.   levothyroxine  (SYNTHROID , LEVOTHROID) 125 MCG tablet Take 125 mcg by mouth daily before breakfast.   Multiple Vitamins-Minerals (MULTIVITAMIN WITH MINERALS) tablet Take 1 tablet by mouth daily.   ramipril  (ALTACE ) 10 MG capsule Take 10 mg by mouth 2 (two) times daily.   sertraline  (ZOLOFT ) 100 MG tablet Take 100 mg by mouth at bedtime.   solifenacin (VESICARE) 10 MG tablet Take 10 mg by mouth daily.    Objective: BP (!) 163/86   Ht 5' 7 (1.702 m)   Wt 269 lb (122 kg)   BMI 42.13 kg/m   Physical Exam:  General: Alert and oriented. and No acute distress. Gait: Ambulates with the assistance of a cane  Evaluation of the right arm demonstrates no swelling.  No bruising.  No point tenderness.  She has near full range of motion of the right elbow.  Fingers are warm and well-perfused.  Sensation intact throughout the right hand.  IMAGING: I personally reviewed images previously obtained from the ED  X-rays of the shoulder and the right forearm are negative for acute injury.  There is some swelling within the elbow joint, without obvious fracture or injury.   New Medications:  No orders of the defined types were placed in this encounter.     Oneil DELENA Horde, MD  03/03/2024 12:36 PM

## 2024-06-06 ENCOUNTER — Encounter: Payer: Self-pay | Admitting: Radiology

## 2024-08-24 ENCOUNTER — Ambulatory Visit: Attending: Internal Medicine

## 2024-08-24 DIAGNOSIS — M6281 Muscle weakness (generalized): Secondary | ICD-10-CM | POA: Insufficient documentation

## 2024-08-24 DIAGNOSIS — R2689 Other abnormalities of gait and mobility: Secondary | ICD-10-CM | POA: Insufficient documentation

## 2024-08-24 NOTE — Therapy (Signed)
 " OUTPATIENT PHYSICAL THERAPY NEURO EVALUATION   Patient Name: Melissa Hunter MRN: [id number] DOB:20-Mar-1956, 69 y.o., female Today's Date: 08/24/2024   PCP:  Corlis Honor BROCKS, MD     REFERRING PROVIDER: Rudolpho Norleen BIRCH, MD   END OF SESSION:  PT End of Session - 08/24/24 1110     Visit Number 1    Progress Note Due on Visit 10    PT Start Time 1109    PT Stop Time 1148    PT Time Calculation (min) 39 min          Past Medical History:  Diagnosis Date   Anxiety    Family history of adverse reaction to anesthesia    PTS BROTHER-AGITATED, COMBATIVE   GERD (gastroesophageal reflux disease)    H/O   Headache     MIGRAINES-RARE   Hypertension    Hypothyroidism    Neuromuscular disorder (HCC)    MS   Sleep apnea    has cpap but has not used in a long time.   Past Surgical History:  Procedure Laterality Date   BOTOX  INJECTION N/A 04/08/2016   Procedure: BOTOX  INJECTION;  Surgeon: Ozell JONELLE Burkes, MD;  Location: [facility];  Service: Urology;  Laterality: N/A;   BOTOX  INJECTION N/A 11/04/2016   Procedure: BOTOX  INJECTION;  Surgeon: Ozell JONELLE Burkes, MD;  Location: [facility];  Service: Urology;  Laterality: N/A;   CYSTOSCOPY N/A 04/08/2016   Procedure: CYSTOSCOPY;  Surgeon: Ozell JONELLE Burkes, MD;  Location: [facility];  Service: Urology;  Laterality: N/A;   CYSTOSCOPY N/A 11/04/2016   Procedure: CYSTOSCOPY;  Surgeon: Ozell JONELLE Burkes, MD;  Location: [facility];  Service: Urology;  Laterality: N/A;   ORIF ANKLE FRACTURE Right 07/23/2021   Procedure: OPEN REDUCTION OF ANKLE DISLOCATION, OPEN REDUCTION INTERNAL FIXATION OF SUBACUTE TRIMALLEOLAR ANKLE FRACTURE WITH POSTERIOR FIXATION, SYNDESMOSIS;  Surgeon: Elsa Lonni JONELLE, MD;  Location: [facility] OR;  Service: Orthopedics;  Laterality: Right;   STAPEDECTOMY     There are no active problems to display for this patient.   ONSET DATE: At least a year.  REFERRING DIAG:   G35.D (ICD-10-CM) - Multiple sclerosis, unspecified    THERAPY DIAG:   Muscle weakness (generalized)  Other abnormalities of gait and mobility  Rationale for Evaluation and Treatment: Rehabilitation  SUBJECTIVE:                                                                                                                                                                                             SUBJECTIVE STATEMENT: Pt reports that she has been diagnosed with MS since 2007. She reports  she has fallen more than a couple times and that she has difficulty standing up from chair. She also reports she has difficulty going up steps/stairs. She reports there are times when she uses FWW, SPC, or no AD. She reports if she especially has diffiuclty with curbs in community. Her most recent fall was yesterday when chasing after her dog. Pt reports that she does a lot of sitting.  Pt accompanied by: self  PERTINENT HISTORY: Multiple sclerosis diagnosed in 2007.   PAIN:  Are you having pain? No  PRECAUTIONS: None  RED FLAGS: None   WEIGHT BEARING RESTRICTIONS: No  FALLS: Has patient fallen in last 6 months? Yes. Number of falls 5  LIVING ENVIRONMENT: Lives with: lives alone Lives in: House/apartment Stairs: Yes: Internal: 1 steps; none Has following equipment at home: Single point cane, Quad cane small base, Walker - 2 wheeled, and Ramped entry  PLOF: Independent  PATIENT GOALS: Improve balance, gait, ambulating on stairs and over curbs.  OBJECTIVE:  Note: Objective measures were completed at Evaluation unless otherwise noted.  DIAGNOSTIC FINDINGS: CLINICAL DATA:  Headache after a fall.   EXAM: CT HEAD WITHOUT CONTRAST   TECHNIQUE: Contiguous axial images were obtained from the base of the skull through the vertex without intravenous contrast.   RADIATION DOSE REDUCTION: This exam was performed according to the departmental dose-optimization program which includes automated exposure control, adjustment of the mA and/or kV according to patient  size and/or use of iterative reconstruction technique.   COMPARISON:  MRI brain 03/10/2022   FINDINGS: Brain: There is no evidence for acute hemorrhage, hydrocephalus, mass lesion, or abnormal extra-axial fluid collection. No definite CT evidence for acute infarction. Diffuse loss of parenchymal volume is consistent with atrophy. Patchy low attenuation in the deep hemispheric and periventricular white matter is nonspecific, but likely reflects chronic microvascular ischemic demyelination.   Vascular: No hyperdense vessel or unexpected calcification.   Skull: No evidence for fracture. No worrisome lytic or sclerotic lesion.   Sinuses/Orbits: The visualized paranasal sinuses and mastoid air cells are clear. Visualized portions of the globes and intraorbital fat are unremarkable.   Other: None.   IMPRESSION: 1. No acute intracranial abnormality. 2. Atrophy with chronic small vessel ischemic disease.  COGNITION: Overall cognitive status: Within functional limits for tasks assessed   SENSATION: WFL  COORDINATION: Decreased coordination with speed and foot placement noted with 6'' toe taps.    M  POSTURE: rounded shoulders  LOWER EXTREMITY ROM:     WFL.   LOWER EXTREMITY MMT:    MMT Right Eval Left Eval  Hip flexion 4 4  Hip extension    Hip abduction 4 4  Hip adduction    Hip internal rotation    Hip external rotation    Knee flexion 5 5  Knee extension 5 5  Ankle dorsiflexion 5 5  Ankle plantarflexion    Ankle inversion    Ankle eversion    (Blank rows = not tested)    STAIRS: Needs at least one UE with ascent and 2 UE with descent with step to pattern. Unable to perform reciprocal pattern unless using both hands for support.  GAIT: Findings: Decreased step length with slightly wide step width and increased weight through upper extremities on FWW for support.  FUNCTIONAL TESTS:  5 times sit to stand: 21.92'' with UE use. 6 minute walk test: To be  performed at initial treatment. 10 meter walk test: 14.60 = 0.68 m/s Berg Balance Scale: To be performed  at initial treatment.  TUG: 23.38 with FWW. Without AD: 21.41 with CGA.   PATIENT SURVEYS:  ABC scale: The Activities-Specific Balance Confidence (ABC) Scale 0% 10 20 30  40 50 60 70 80 90 100% No confidence<->completely confident  How confident are you that you will not lose your balance or become unsteady when you . . .   Date tested 07/24/2025  Walk around the house 80%  2. Walk up or down stairs 20%  3. Bend over and pick up a slipper from in front of a closet floor 20%  4. Reach for a small can off a shelf at eye level 100%  5. Stand on tip toes and reach for something above your head 40%  6. Stand on a chair and reach for something 0%  7. Sweep the floor 50%  8. Walk outside the house to a car parked in the driveway 40%  9. Get into or out of a car 100%  10. Walk across a parking lot to the mall 20%  11. Walk up or down a ramp 90%  12. Walk in a crowded mall where people rapidly walk past you 100%  13. Are bumped into by people as you walk through the mall 80%  14. Step onto or off of an escalator while you are holding onto the railing 80%  15. Step onto or off an escalator while holding onto parcels such that you cannot hold onto the railing 0%  16. Walk outside on icy sidewalks 0%  Total: #/16 51.25%                                                                                                                                 TREATMENT DATE: 08/24/2024  -HEP + education on safety with gait at home and home management to reduce risk of falls.   PATIENT EDUCATION: Education detailsNetwork Engineer with gait/balance and home management.  Person educated: Patient Education method: Explanation, Demonstration, and Verbal cues Education comprehension: verbalized understanding and needs further education  HOME EXERCISE PROGRAM: Sit to stands with proper mechanics and no UE use  2x10.   GOALS: Goals reviewed with patient? Yes  SHORT TERM GOALS: Target date: 09/07/2024  Patient will be independent with HEP in 2 weeks allowing her to perform exercises at home with good mechanics and increased safety.  Baseline:HEP initiated at evaluation. Goal status: INITIAL   LONG TERM GOALS: Target date: 11/16/2024   1.  Patient (> 34 years old) will complete five times sit to stand test in < 15 seconds indicating an increased LE strength and improved balance. Baseline: 21.92'' with bilateral UE use. Goal status: INITIAL  2.  Patient will increase Berg Balance score by > 6 points to demonstrate decreased fall risk during functional activities. Baseline: To be performed.  Goal status: INITIAL   3.  Patient will reduce timed up and go to <11 seconds to reduce fall risk and demonstrate  improved transfer/gait ability. Baseline: 21.41 without AD. Goal status: INITIAL  4.  Patient will increase 10 meter walk test to >1.67m/s as to improve gait speed for better community ambulation and to reduce fall risk. Baseline: 0.68 m/s using FWW.  Goal status: INITIAL  5.  Patient will increase six minute walk test distance to >1000 for progression to community ambulator and improve gait ability Baseline: To be performed.  Goal status: INITIAL  6.  Patient will increase ABC Scale score by 15% to demonstrate decreased fall risk during functional activities. Baseline: 51.25% Goal status: INITIAL   ASSESSMENT:  CLINICAL IMPRESSION: Patient is a 69 y.o. femalie who was seen today for physical therapy evaluation and treatment for generalized weakness and recent falls. She reports falling x5 in past 6 months. Patient has multiple sclerosis. She reports most difficulty with standing up from chair, walking up/down stairs, and walking over curbs.  Objectively, decreased gait speed along with gait abnormalities noted making gait inefficient. Decreased LE strength noted with MMT. Decreased LE  coordination noted with toe taps. Decreased balance noted with all standing activities during evaluation. Patient is a good candidate for skilled PT 2x/week to improve on deficits listed above allowing her to perform ADLs/household chores, gait, and functional activities with less difficulty and decreased risk for falls.    OBJECTIVE IMPAIRMENTS: Abnormal gait, decreased balance, decreased endurance, difficulty walking, and decreased strength.   ACTIVITY LIMITATIONS: carrying, lifting, bending, sitting, standing, squatting, stairs, and transfers  PARTICIPATION LIMITATIONS: meal prep, cleaning, laundry, driving, shopping, community activity, occupation, and yard work  PERSONAL FACTORS: Age, Fitness, Past/current experiences, and 1-2 comorbidities: Hypertension, hypothyroidism are also affecting patient's functional outcome.   REHAB POTENTIAL: Good  CLINICAL DECISION MAKING: Stable/uncomplicated  EVALUATION COMPLEXITY: Moderate  PLAN:  PT FREQUENCY: 2x/week  PT DURATION: 12 weeks  PLANNED INTERVENTIONS: 97164- PT Re-evaluation, 97750- Physical Performance Testing, 97110-Therapeutic exercises, 97530- Therapeutic activity, [id number]- Neuromuscular re-education, 97535- Self Care, [id number]- Manual therapy, [phone]- Gait training, Patient/Family education, Balance training, Stair training, Vestibular training, and Cryotherapy  PLAN FOR NEXT SESSION:  -Perform 6 minute walk test  -Perform BERG   -Update HEP (only been instructed for sit to stands 2x10 with no UE and proper form at evaluation).    Norman KATHEE Sharps, PT 08/24/2024, 2:30 PM        "

## 2024-08-31 ENCOUNTER — Ambulatory Visit

## 2024-09-02 ENCOUNTER — Ambulatory Visit

## 2024-09-02 DIAGNOSIS — M6281 Muscle weakness (generalized): Secondary | ICD-10-CM

## 2024-09-02 DIAGNOSIS — R2689 Other abnormalities of gait and mobility: Secondary | ICD-10-CM

## 2024-09-06 ENCOUNTER — Ambulatory Visit

## 2024-09-08 ENCOUNTER — Ambulatory Visit

## 2024-09-08 DIAGNOSIS — M6281 Muscle weakness (generalized): Secondary | ICD-10-CM

## 2024-09-08 DIAGNOSIS — R2689 Other abnormalities of gait and mobility: Secondary | ICD-10-CM

## 2024-09-08 NOTE — Therapy (Signed)
 " OUTPATIENT PHYSICAL THERAPY NEURO EVALUATION   Patient Name: Melissa Hunter MRN: [id number] DOB:03/20/1956, 69 y.o., female Today's Date: 09/08/2024   PCP:  Corlis Honor BROCKS, MD     REFERRING PROVIDER: Rudolpho Norleen BIRCH, MD   END OF SESSION:  PT End of Session - 09/08/24 1318     Visit Number 3    Date for Recertification  11/16/24    Progress Note Due on Visit 10    PT Start Time 1316    PT Stop Time 1401    PT Time Calculation (min) 45 min    Equipment Utilized During Treatment Gait belt    Behavior During Therapy WFL for tasks assessed/performed          Past Medical History:  Diagnosis Date   Anxiety    Family history of adverse reaction to anesthesia    PTS BROTHER-AGITATED, COMBATIVE   GERD (gastroesophageal reflux disease)    H/O   Headache     MIGRAINES-RARE   Hypertension    Hypothyroidism    Neuromuscular disorder (HCC)    MS   Sleep apnea    has cpap but has not used in a long time.   Past Surgical History:  Procedure Laterality Date   BOTOX  INJECTION N/A 04/08/2016   Procedure: BOTOX  INJECTION;  Surgeon: Ozell JONELLE Burkes, MD;  Location: [facility];  Service: Urology;  Laterality: N/A;   BOTOX  INJECTION N/A 11/04/2016   Procedure: BOTOX  INJECTION;  Surgeon: Ozell JONELLE Burkes, MD;  Location: [facility];  Service: Urology;  Laterality: N/A;   CYSTOSCOPY N/A 04/08/2016   Procedure: CYSTOSCOPY;  Surgeon: Ozell JONELLE Burkes, MD;  Location: [facility];  Service: Urology;  Laterality: N/A;   CYSTOSCOPY N/A 11/04/2016   Procedure: CYSTOSCOPY;  Surgeon: Ozell JONELLE Burkes, MD;  Location: [facility];  Service: Urology;  Laterality: N/A;   ORIF ANKLE FRACTURE Right 07/23/2021   Procedure: OPEN REDUCTION OF ANKLE DISLOCATION, OPEN REDUCTION INTERNAL FIXATION OF SUBACUTE TRIMALLEOLAR ANKLE FRACTURE WITH POSTERIOR FIXATION, SYNDESMOSIS;  Surgeon: Elsa Lonni JONELLE, MD;  Location: [facility];  Service: Orthopedics;  Laterality: Right;   STAPEDECTOMY     There are no active problems to  display for this patient.   ONSET DATE: At least a year.  REFERRING DIAG:   G35.D (ICD-10-CM) - Multiple sclerosis, unspecified    THERAPY DIAG:  Muscle weakness (generalized)  Other abnormalities of gait and mobility  Rationale for Evaluation and Treatment: Rehabilitation  SUBJECTIVE:  SUBJECTIVE STATEMENT:   Today: Patient reports doing okay today without any recent falls. She reports she has been tired lately, but she tolerated previous treatment well.    Per EVAL: Pt reports that she has been diagnosed with MS since 2007. She reports she has fallen more than a couple times and that she has difficulty standing up from chair. She also reports she has difficulty going up steps/stairs. She reports there are times when she uses FWW, SPC, or no AD. She reports if she especially has diffiuclty with curbs in community. Her most recent fall was yesterday when chasing after her dog. Pt reports that she does a lot of sitting.  Pt accompanied by: self  PERTINENT HISTORY: Multiple sclerosis diagnosed in 2007.   PAIN:  Are you having pain? No  PRECAUTIONS: None  RED FLAGS: None   WEIGHT BEARING RESTRICTIONS: No  FALLS: Has patient fallen in last 6 months? Yes. Number of falls 5  LIVING ENVIRONMENT: Lives with: lives alone Lives in: House/apartment Stairs: Yes: Internal: 1 steps; none Has following equipment at home: Single point cane, Quad cane small base, Walker - 2 wheeled, and Ramped entry  PLOF: Independent  PATIENT GOALS: Improve balance, gait, ambulating on stairs and over curbs.  OBJECTIVE:  Note: Objective measures were completed at Evaluation unless otherwise noted.  DIAGNOSTIC FINDINGS: CLINICAL DATA:  Headache after a fall.   EXAM: CT HEAD WITHOUT CONTRAST    TECHNIQUE: Contiguous axial images were obtained from the base of the skull through the vertex without intravenous contrast.   RADIATION DOSE REDUCTION: This exam was performed according to the departmental dose-optimization program which includes automated exposure control, adjustment of the mA and/or kV according to patient size and/or use of iterative reconstruction technique.   COMPARISON:  MRI brain 03/10/2022   FINDINGS: Brain: There is no evidence for acute hemorrhage, hydrocephalus, mass lesion, or abnormal extra-axial fluid collection. No definite CT evidence for acute infarction. Diffuse loss of parenchymal volume is consistent with atrophy. Patchy low attenuation in the deep hemispheric and periventricular white matter is nonspecific, but likely reflects chronic microvascular ischemic demyelination.   Vascular: No hyperdense vessel or unexpected calcification.   Skull: No evidence for fracture. No worrisome lytic or sclerotic lesion.   Sinuses/Orbits: The visualized paranasal sinuses and mastoid air cells are clear. Visualized portions of the globes and intraorbital fat are unremarkable.   Other: None.   IMPRESSION: 1. No acute intracranial abnormality. 2. Atrophy with chronic small vessel ischemic disease.  COGNITION: Overall cognitive status: Within functional limits for tasks assessed   SENSATION: WFL  COORDINATION: Decreased coordination with speed and foot placement noted with 6'' toe taps.    M  POSTURE: rounded shoulders  LOWER EXTREMITY ROM:     WFL.   LOWER EXTREMITY MMT:    MMT Right Eval Left Eval  Hip flexion 4 4  Hip extension    Hip abduction 4 4  Hip adduction    Hip internal rotation    Hip external rotation    Knee flexion 5 5  Knee extension 5 5  Ankle dorsiflexion 5 5  Ankle plantarflexion    Ankle inversion    Ankle eversion    (Blank rows = not tested)    STAIRS: Needs at least one UE with ascent and 2 UE with  descent with step to pattern. Unable to perform reciprocal pattern unless using both hands for support.  GAIT: Findings: Decreased step length with slightly wide step width and increased  weight through upper extremities on FWW for support.  FUNCTIONAL TESTS:  5 times sit to stand: 21.92'' with UE use. 6 minute walk test: To be performed at initial treatment. 10 meter walk test: 14.60 = 0.68 m/s Berg Balance Scale: To be performed at initial treatment.  TUG: 23.38 with FWW. Without AD: 21.41 with CGA.   PATIENT SURVEYS:  ABC scale: The Activities-Specific Balance Confidence (ABC) Scale 0% 10 20 30  40 50 60 70 80 90 100% No confidence<->completely confident  How confident are you that you will not lose your balance or become unsteady when you . . .   Date tested 07/24/2025  Walk around the house 80%  2. Walk up or down stairs 20%  3. Bend over and pick up a slipper from in front of a closet floor 20%  4. Reach for a small can off a shelf at eye level 100%  5. Stand on tip toes and reach for something above your head 40%  6. Stand on a chair and reach for something 0%  7. Sweep the floor 50%  8. Walk outside the house to a car parked in the driveway 40%  9. Get into or out of a car 100%  10. Walk across a parking lot to the mall 20%  11. Walk up or down a ramp 90%  12. Walk in a crowded mall where people rapidly walk past you 100%  13. Are bumped into by people as you walk through the mall 80%  14. Step onto or off of an escalator while you are holding onto the railing 80%  15. Step onto or off an escalator while holding onto parcels such that you cannot hold onto the railing 0%  16. Walk outside on icy sidewalks 0%  Total: #/16 51.25%                                                                                                                                 TREATMENT DATE: 08/24/2024   -Sit to stands: 2x10 -GT x 450' without AD CGA working to improve speed and step width.     -At balance Station:  -Romberg stance on Airex: 3x30''  -1/2 tandem stance: 2x30'' each.  -Standing with EC: 3x30''  -Stairs: x4 laps with step to pattern and one hand rail.   -Step up: 1x10 each.   -Obstacle course in // bars:  1 Airex, 2 6'' steps, and 1 4'' step (2laps).       PATIENT EDUCATION: Education detailsNetwork Engineer with gait/balance and home management.  Person educated: Patient Education method: Explanation, Demonstration, and Verbal cues Education comprehension: verbalized understanding and needs further education  HOME EXERCISE PROGRAM: . Access Code: [email or web address] URL: [email or web address] Date: 09/02/2024 Prepared by: Norman Sharps  Exercises - Standing Romberg to 1/2 Tandem Stance  - 1 x daily - 7 x weekly - 1 sets - 3 reps - 30 seconds hold - Romberg Stance  -  1 x daily - 7 x weekly - 1 sets - 3 reps - 30''  hold - Sit to Stand  - 1 x daily - 7 x weekly - 2 sets - 10 reps - Standing Hip Abduction with Counter Support  - 1 x daily - 7 x weekly - 2 sets - 10 reps  GOALS: Goals reviewed with patient? Yes  SHORT TERM GOALS: Target date: 09/07/2024  Patient will be independent with HEP in 2 weeks allowing her to perform exercises at home with good mechanics and increased safety.  Baseline:HEP initiated at evaluation. Goal status: INITIAL   LONG TERM GOALS: Target date: 11/16/2024   1.  Patient (> 33 years old) will complete five times sit to stand test in < 15 seconds indicating an increased LE strength and improved balance. Baseline: 21.92'' with bilateral UE use. Goal status: INITIAL  2.  Patient will increase Berg Balance score by > 6 points to demonstrate decreased fall risk during functional activities. Baseline: 29/56 Goal status: INITIAL   3.  Patient will reduce timed up and go to <11 seconds to reduce fall risk and demonstrate improved transfer/gait ability. Baseline: 21.41 without AD. Goal status: INITIAL  4.  Patient will  increase 10 meter walk test to >1.94m/s as to improve gait speed for better community ambulation and to reduce fall risk. Baseline: 0.68 m/s using FWW.  Goal status: INITIAL  5.  Patient will increase six minute walk test distance to >1000 for progression to community ambulator and improve gait ability Baseline: 91' with FWW.   Goal status: INITIAL  6.  Patient will increase ABC Scale score by 15% to demonstrate decreased fall risk during functional activities. Baseline: 51.25% Goal status: INITIAL   ASSESSMENT:  CLINICAL IMPRESSION: Today's treatment focused on improving balance, functional movements allowing her to improve with stairs, transfers, and ambulation over/around obstacles at home. Decreased balance noted during balance exercises today as min. A required at times to regain balance. Poor confident in balance noted during ambulation over obstacles in // bars as she had difficulty letting go of the rails. Decreased strength noted during stairs as she had to use Ues to assist with push off to next step and controlled eccentric descent.   . Overall, patient continues to be highly motivated to improve throughout treatment session. Patient will benefit from continuing skilled PT 2x/week at this time for further improvements in deficits listed above.   OBJECTIVE IMPAIRMENTS: Abnormal gait, decreased balance, decreased endurance, difficulty walking, and decreased strength.   ACTIVITY LIMITATIONS: carrying, lifting, bending, sitting, standing, squatting, stairs, and transfers  PARTICIPATION LIMITATIONS: meal prep, cleaning, laundry, driving, shopping, community activity, occupation, and yard work  PERSONAL FACTORS: Age, Fitness, Past/current experiences, and 1-2 comorbidities: Hypertension, hypothyroidism are also affecting patient's functional outcome.   REHAB POTENTIAL: Good  CLINICAL DECISION MAKING: Stable/uncomplicated  EVALUATION COMPLEXITY: Moderate  PLAN:  PT FREQUENCY:  2x/week  PT DURATION: 12 weeks  PLANNED INTERVENTIONS: 97164- PT Re-evaluation, 97750- Physical Performance Testing, 97110-Therapeutic exercises, 97530- Therapeutic activity, [id number]- Neuromuscular re-education, 97535- Self Care, [id number]- Manual therapy, [id number]- Gait training, Patient/Family education, Balance training, Stair training, Vestibular training, and Cryotherapy  PLAN FOR NEXT SESSION:  -Continue working to improve balance over obstacles.  -Continue working to improve ability to perform stairs.  -Continue working to improve independence with gait.    Norman Sharps, PT, DPT Physical Therapist - [facility]  Norman KATHEE Sharps, [place] 09/08/2024, 4:22 PM        "

## 2024-09-13 ENCOUNTER — Ambulatory Visit

## 2024-09-15 ENCOUNTER — Ambulatory Visit

## 2024-09-20 ENCOUNTER — Ambulatory Visit

## 2024-09-22 ENCOUNTER — Ambulatory Visit

## 2024-09-27 ENCOUNTER — Ambulatory Visit

## 2024-09-29 ENCOUNTER — Ambulatory Visit

## 2024-10-10 ENCOUNTER — Ambulatory Visit: Admitting: Physical Therapy

## 2024-10-13 ENCOUNTER — Ambulatory Visit: Admitting: Physical Therapy

## 2024-10-18 ENCOUNTER — Ambulatory Visit: Admitting: Physical Therapy

## 2024-10-20 ENCOUNTER — Ambulatory Visit: Admitting: Physical Therapy

## 2024-10-25 ENCOUNTER — Ambulatory Visit: Admitting: Physical Therapy

## 2024-10-27 ENCOUNTER — Ambulatory Visit: Admitting: Physical Therapy

## 2024-11-01 ENCOUNTER — Ambulatory Visit: Admitting: Physical Therapy

## 2024-11-03 ENCOUNTER — Ambulatory Visit: Admitting: Physical Therapy

## 2024-11-08 ENCOUNTER — Ambulatory Visit: Admitting: Physical Therapy

## 2024-11-10 ENCOUNTER — Ambulatory Visit: Admitting: Physical Therapy

## 2024-11-15 ENCOUNTER — Ambulatory Visit: Admitting: Physical Therapy

## 2024-11-17 ENCOUNTER — Ambulatory Visit: Admitting: Physical Therapy

## 2024-11-22 ENCOUNTER — Ambulatory Visit: Admitting: Physical Therapy

## 2024-11-24 ENCOUNTER — Ambulatory Visit: Admitting: Physical Therapy

## 2024-11-29 ENCOUNTER — Ambulatory Visit: Admitting: Physical Therapy

## 2024-12-01 ENCOUNTER — Ambulatory Visit: Admitting: Physical Therapy

## 2024-12-06 ENCOUNTER — Ambulatory Visit: Admitting: Physical Therapy

## 2024-12-08 ENCOUNTER — Ambulatory Visit: Admitting: Physical Therapy

## 2024-12-13 ENCOUNTER — Ambulatory Visit: Admitting: Physical Therapy

## 2024-12-15 ENCOUNTER — Ambulatory Visit: Admitting: Physical Therapy

## 2024-12-20 ENCOUNTER — Ambulatory Visit: Admitting: Physical Therapy

## 2024-12-22 ENCOUNTER — Ambulatory Visit: Admitting: Physical Therapy
